# Patient Record
Sex: Female | Born: 1950
Health system: Southern US, Community
[De-identification: ages and names within clinical notes are randomized; demographics above are authoritative.]

## PROBLEM LIST (undated history)

## (undated) DIAGNOSIS — I499 Cardiac arrhythmia, unspecified: Secondary | ICD-10-CM

## (undated) DIAGNOSIS — H269 Unspecified cataract: Secondary | ICD-10-CM

## (undated) DIAGNOSIS — Z923 Personal history of irradiation: Secondary | ICD-10-CM

## (undated) DIAGNOSIS — IMO0001 Reserved for inherently not codable concepts without codable children: Secondary | ICD-10-CM

## (undated) DIAGNOSIS — Z5189 Encounter for other specified aftercare: Secondary | ICD-10-CM

## (undated) DIAGNOSIS — M1990 Unspecified osteoarthritis, unspecified site: Secondary | ICD-10-CM

## (undated) DIAGNOSIS — C50919 Malignant neoplasm of unspecified site of unspecified female breast: Secondary | ICD-10-CM

## (undated) DIAGNOSIS — F419 Anxiety disorder, unspecified: Secondary | ICD-10-CM

## (undated) DIAGNOSIS — Z973 Presence of spectacles and contact lenses: Secondary | ICD-10-CM

## (undated) DIAGNOSIS — E079 Disorder of thyroid, unspecified: Secondary | ICD-10-CM

## (undated) HISTORY — DX: Personal history of irradiation: Z92.3

## (undated) HISTORY — DX: Unspecified cataract: H26.9

## (undated) HISTORY — DX: Malignant neoplasm of unspecified site of unspecified female breast: C50.919

## (undated) HISTORY — DX: Disorder of thyroid, unspecified: E07.9

---

## 1993-03-07 HISTORY — PX: CYSTECTOMY: SHX5119

## 1997-10-08 ENCOUNTER — Other Ambulatory Visit: Admission: RE | Admit: 1997-10-08 | Discharge: 1997-10-08 | Payer: Self-pay | Admitting: Obstetrics & Gynecology

## 1997-10-15 ENCOUNTER — Ambulatory Visit (HOSPITAL_COMMUNITY): Admission: RE | Admit: 1997-10-15 | Discharge: 1997-10-15 | Payer: Self-pay | Admitting: Obstetrics & Gynecology

## 1998-10-20 ENCOUNTER — Ambulatory Visit (HOSPITAL_COMMUNITY): Admission: RE | Admit: 1998-10-20 | Discharge: 1998-10-20 | Payer: Self-pay | Admitting: Obstetrics & Gynecology

## 1998-10-20 ENCOUNTER — Encounter: Payer: Self-pay | Admitting: Obstetrics & Gynecology

## 1999-02-01 ENCOUNTER — Other Ambulatory Visit: Admission: RE | Admit: 1999-02-01 | Discharge: 1999-02-01 | Payer: Self-pay | Admitting: Obstetrics & Gynecology

## 1999-10-22 ENCOUNTER — Encounter: Payer: Self-pay | Admitting: Obstetrics & Gynecology

## 1999-10-22 ENCOUNTER — Ambulatory Visit (HOSPITAL_COMMUNITY): Admission: RE | Admit: 1999-10-22 | Discharge: 1999-10-22 | Payer: Self-pay | Admitting: Obstetrics & Gynecology

## 1999-10-28 ENCOUNTER — Encounter: Payer: Self-pay | Admitting: Obstetrics & Gynecology

## 1999-10-28 ENCOUNTER — Encounter: Admission: RE | Admit: 1999-10-28 | Discharge: 1999-10-28 | Payer: Self-pay | Admitting: Obstetrics & Gynecology

## 2000-07-05 ENCOUNTER — Encounter: Admission: RE | Admit: 2000-07-05 | Discharge: 2000-07-05 | Payer: Self-pay | Admitting: Obstetrics & Gynecology

## 2000-07-05 ENCOUNTER — Encounter: Payer: Self-pay | Admitting: Obstetrics & Gynecology

## 2000-07-13 ENCOUNTER — Ambulatory Visit (HOSPITAL_COMMUNITY): Admission: RE | Admit: 2000-07-13 | Discharge: 2000-07-13 | Payer: Self-pay | Admitting: Cardiology

## 2000-12-01 ENCOUNTER — Encounter: Payer: Self-pay | Admitting: Obstetrics & Gynecology

## 2000-12-01 ENCOUNTER — Encounter: Admission: RE | Admit: 2000-12-01 | Discharge: 2000-12-01 | Payer: Self-pay | Admitting: Obstetrics & Gynecology

## 2001-03-27 ENCOUNTER — Other Ambulatory Visit: Admission: RE | Admit: 2001-03-27 | Discharge: 2001-03-27 | Payer: Self-pay | Admitting: Obstetrics & Gynecology

## 2002-03-25 ENCOUNTER — Encounter: Payer: Self-pay | Admitting: Obstetrics & Gynecology

## 2002-03-25 ENCOUNTER — Encounter: Admission: RE | Admit: 2002-03-25 | Discharge: 2002-03-25 | Payer: Self-pay | Admitting: Obstetrics & Gynecology

## 2002-05-31 ENCOUNTER — Other Ambulatory Visit: Admission: RE | Admit: 2002-05-31 | Discharge: 2002-05-31 | Payer: Self-pay | Admitting: Obstetrics & Gynecology

## 2003-06-05 ENCOUNTER — Encounter: Admission: RE | Admit: 2003-06-05 | Discharge: 2003-06-05 | Payer: Self-pay | Admitting: Obstetrics & Gynecology

## 2003-06-10 ENCOUNTER — Other Ambulatory Visit: Admission: RE | Admit: 2003-06-10 | Discharge: 2003-06-10 | Payer: Self-pay | Admitting: Obstetrics & Gynecology

## 2004-06-15 ENCOUNTER — Encounter: Admission: RE | Admit: 2004-06-15 | Discharge: 2004-06-15 | Payer: Self-pay | Admitting: Obstetrics & Gynecology

## 2004-07-08 ENCOUNTER — Other Ambulatory Visit: Admission: RE | Admit: 2004-07-08 | Discharge: 2004-07-08 | Payer: Self-pay | Admitting: Obstetrics & Gynecology

## 2005-07-06 ENCOUNTER — Encounter: Admission: RE | Admit: 2005-07-06 | Discharge: 2005-07-06 | Payer: Self-pay | Admitting: Obstetrics & Gynecology

## 2006-07-11 ENCOUNTER — Encounter: Admission: RE | Admit: 2006-07-11 | Discharge: 2006-07-11 | Payer: Self-pay | Admitting: Obstetrics & Gynecology

## 2007-03-08 HISTORY — PX: COLONOSCOPY: SHX174

## 2007-05-09 ENCOUNTER — Ambulatory Visit: Payer: Self-pay | Admitting: Gastroenterology

## 2007-05-21 ENCOUNTER — Ambulatory Visit: Payer: Self-pay | Admitting: Gastroenterology

## 2007-07-12 ENCOUNTER — Encounter: Admission: RE | Admit: 2007-07-12 | Discharge: 2007-07-12 | Payer: Self-pay | Admitting: Obstetrics & Gynecology

## 2008-04-01 ENCOUNTER — Encounter: Admission: RE | Admit: 2008-04-01 | Discharge: 2008-06-30 | Payer: Self-pay | Admitting: Orthopaedic Surgery

## 2008-07-03 ENCOUNTER — Encounter: Admission: RE | Admit: 2008-07-03 | Discharge: 2008-07-16 | Payer: Self-pay | Admitting: Orthopaedic Surgery

## 2008-07-16 ENCOUNTER — Encounter: Admission: RE | Admit: 2008-07-16 | Discharge: 2008-07-16 | Payer: Self-pay | Admitting: Obstetrics & Gynecology

## 2009-07-17 ENCOUNTER — Encounter: Admission: RE | Admit: 2009-07-17 | Discharge: 2009-07-17 | Payer: Self-pay | Admitting: Obstetrics & Gynecology

## 2009-07-23 ENCOUNTER — Encounter: Admission: RE | Admit: 2009-07-23 | Discharge: 2009-07-23 | Payer: Self-pay | Admitting: Obstetrics & Gynecology

## 2009-08-28 ENCOUNTER — Emergency Department (HOSPITAL_COMMUNITY): Admission: EM | Admit: 2009-08-28 | Discharge: 2009-08-28 | Payer: Self-pay | Admitting: Emergency Medicine

## 2010-01-08 ENCOUNTER — Encounter: Admission: RE | Admit: 2010-01-08 | Discharge: 2010-01-08 | Payer: Self-pay | Admitting: Obstetrics & Gynecology

## 2010-05-23 LAB — POCT CARDIAC MARKERS: Myoglobin, poc: 58.5 ng/mL (ref 12–200)

## 2010-05-23 LAB — DIFFERENTIAL
Basophils Absolute: 0 10*3/uL (ref 0.0–0.1)
Eosinophils Absolute: 0.5 10*3/uL (ref 0.0–0.7)
Eosinophils Relative: 8 % — ABNORMAL HIGH (ref 0–5)
Lymphocytes Relative: 36 % (ref 12–46)
Lymphs Abs: 2.2 10*3/uL (ref 0.7–4.0)
Monocytes Absolute: 0.7 10*3/uL (ref 0.1–1.0)
Neutro Abs: 2.7 10*3/uL (ref 1.7–7.7)

## 2010-05-23 LAB — GLUCOSE, CAPILLARY: Glucose-Capillary: 89 mg/dL (ref 70–99)

## 2010-05-23 LAB — CBC
HCT: 35.1 % — ABNORMAL LOW (ref 36.0–46.0)
Hemoglobin: 11.9 g/dL — ABNORMAL LOW (ref 12.0–15.0)
MCH: 27.8 pg (ref 26.0–34.0)
Platelets: 249 10*3/uL (ref 150–400)
WBC: 6.1 10*3/uL (ref 4.0–10.5)

## 2010-05-23 LAB — POCT I-STAT, CHEM 8
BUN: 18 mg/dL (ref 6–23)
Calcium, Ion: 1.16 mmol/L (ref 1.12–1.32)
Chloride: 104 mEq/L (ref 96–112)
Creatinine, Ser: 0.9 mg/dL (ref 0.4–1.2)
Glucose, Bld: 93 mg/dL (ref 70–99)
Sodium: 140 mEq/L (ref 135–145)

## 2010-05-23 LAB — URINALYSIS, ROUTINE W REFLEX MICROSCOPIC
Bilirubin Urine: NEGATIVE
Hgb urine dipstick: NEGATIVE
Ketones, ur: NEGATIVE mg/dL
Nitrite: NEGATIVE
Specific Gravity, Urine: >1.03 — ABNORMAL HIGH (ref 1.005–1.030)
pH: 5.5 (ref 5.0–8.0)

## 2010-07-28 ENCOUNTER — Other Ambulatory Visit: Payer: Self-pay | Admitting: Obstetrics & Gynecology

## 2010-07-28 DIAGNOSIS — N63 Unspecified lump in unspecified breast: Secondary | ICD-10-CM

## 2010-08-16 ENCOUNTER — Ambulatory Visit
Admission: RE | Admit: 2010-08-16 | Discharge: 2010-08-16 | Disposition: A | Discharge status: Home / Self Care | Payer: BC Managed Care – PPO | Source: Ambulatory Visit | Attending: Obstetrics & Gynecology | Admitting: Obstetrics & Gynecology

## 2010-08-16 DIAGNOSIS — N63 Unspecified lump in unspecified breast: Secondary | ICD-10-CM

## 2011-03-08 HISTORY — PX: BREAST LUMPECTOMY: SHX2

## 2011-08-29 ENCOUNTER — Other Ambulatory Visit: Payer: Self-pay | Admitting: Obstetrics & Gynecology

## 2011-08-29 DIAGNOSIS — Z1231 Encounter for screening mammogram for malignant neoplasm of breast: Secondary | ICD-10-CM

## 2011-09-06 ENCOUNTER — Ambulatory Visit: Payer: BC Managed Care – PPO

## 2011-09-16 ENCOUNTER — Ambulatory Visit
Admission: RE | Admit: 2011-09-16 | Discharge: 2011-09-16 | Disposition: A | Discharge status: Home / Self Care | Payer: BC Managed Care – PPO | Source: Ambulatory Visit | Attending: Obstetrics & Gynecology | Admitting: Obstetrics & Gynecology

## 2011-09-16 DIAGNOSIS — Z1231 Encounter for screening mammogram for malignant neoplasm of breast: Secondary | ICD-10-CM

## 2011-09-20 ENCOUNTER — Other Ambulatory Visit: Payer: Self-pay | Admitting: Obstetrics & Gynecology

## 2011-09-20 DIAGNOSIS — R928 Other abnormal and inconclusive findings on diagnostic imaging of breast: Secondary | ICD-10-CM

## 2011-10-04 ENCOUNTER — Ambulatory Visit
Admission: RE | Admit: 2011-10-04 | Discharge: 2011-10-04 | Disposition: A | Discharge status: Home / Self Care | Payer: BC Managed Care – PPO | Source: Ambulatory Visit | Attending: Obstetrics & Gynecology | Admitting: Obstetrics & Gynecology

## 2011-10-04 ENCOUNTER — Other Ambulatory Visit: Payer: Self-pay | Admitting: Obstetrics & Gynecology

## 2011-10-04 DIAGNOSIS — R928 Other abnormal and inconclusive findings on diagnostic imaging of breast: Secondary | ICD-10-CM

## 2011-10-04 DIAGNOSIS — C50919 Malignant neoplasm of unspecified site of unspecified female breast: Secondary | ICD-10-CM

## 2011-10-04 HISTORY — PX: OTHER SURGICAL HISTORY: SHX169

## 2011-10-04 HISTORY — DX: Malignant neoplasm of unspecified site of unspecified female breast: C50.919

## 2011-10-05 ENCOUNTER — Other Ambulatory Visit: Payer: Self-pay | Admitting: Obstetrics & Gynecology

## 2011-10-05 ENCOUNTER — Ambulatory Visit
Admission: RE | Admit: 2011-10-05 | Discharge: 2011-10-05 | Disposition: A | Discharge status: Home / Self Care | Payer: BC Managed Care – PPO | Source: Ambulatory Visit | Attending: Obstetrics & Gynecology | Admitting: Obstetrics & Gynecology

## 2011-10-05 DIAGNOSIS — C50911 Malignant neoplasm of unspecified site of right female breast: Secondary | ICD-10-CM

## 2011-10-05 DIAGNOSIS — R928 Other abnormal and inconclusive findings on diagnostic imaging of breast: Secondary | ICD-10-CM

## 2011-10-06 ENCOUNTER — Telehealth: Payer: Self-pay | Admitting: *Deleted

## 2011-10-06 ENCOUNTER — Other Ambulatory Visit: Payer: Self-pay | Admitting: *Deleted

## 2011-10-06 DIAGNOSIS — C50419 Malignant neoplasm of upper-outer quadrant of unspecified female breast: Secondary | ICD-10-CM

## 2011-10-06 NOTE — Telephone Encounter (Signed)
Confirmed [facility] for 8/7/13at 0800 .  Instructions and contact information given.

## 2011-10-10 ENCOUNTER — Ambulatory Visit
Admission: RE | Admit: 2011-10-10 | Discharge: 2011-10-10 | Disposition: A | Discharge status: Home / Self Care | Payer: BC Managed Care – PPO | Source: Ambulatory Visit | Attending: Obstetrics & Gynecology | Admitting: Obstetrics & Gynecology

## 2011-10-10 DIAGNOSIS — C50911 Malignant neoplasm of unspecified site of right female breast: Secondary | ICD-10-CM

## 2011-10-10 MED ORDER — GADOBENATE DIMEGLUMINE 529 MG/ML IV SOLN
13.0000 mL | Freq: Once | INTRAVENOUS | Status: AC | PRN
  Administered 2011-10-10: 13 mL via INTRAVENOUS

## 2011-10-12 ENCOUNTER — Encounter (INDEPENDENT_AMBULATORY_CARE_PROVIDER_SITE_OTHER): Payer: BC Managed Care – PPO | Admitting: Surgery

## 2011-10-12 ENCOUNTER — Ambulatory Visit: Payer: BC Managed Care – PPO | Attending: Surgery | Admitting: Physical Therapy

## 2011-10-12 ENCOUNTER — Ambulatory Visit (HOSPITAL_BASED_OUTPATIENT_CLINIC_OR_DEPARTMENT_OTHER): Payer: BC Managed Care – PPO | Admitting: Oncology

## 2011-10-12 ENCOUNTER — Encounter (INDEPENDENT_AMBULATORY_CARE_PROVIDER_SITE_OTHER): Payer: Self-pay | Admitting: Surgery

## 2011-10-12 ENCOUNTER — Ambulatory Visit (HOSPITAL_BASED_OUTPATIENT_CLINIC_OR_DEPARTMENT_OTHER): Payer: BC Managed Care – PPO | Admitting: Surgery

## 2011-10-12 ENCOUNTER — Encounter: Payer: Self-pay | Admitting: *Deleted

## 2011-10-12 ENCOUNTER — Ambulatory Visit: Payer: BC Managed Care – PPO

## 2011-10-12 ENCOUNTER — Other Ambulatory Visit (HOSPITAL_BASED_OUTPATIENT_CLINIC_OR_DEPARTMENT_OTHER): Payer: BC Managed Care – PPO

## 2011-10-12 ENCOUNTER — Other Ambulatory Visit (INDEPENDENT_AMBULATORY_CARE_PROVIDER_SITE_OTHER): Payer: Self-pay | Admitting: Surgery

## 2011-10-12 ENCOUNTER — Ambulatory Visit
Admission: RE | Admit: 2011-10-12 | Discharge: 2011-10-12 | Disposition: A | Discharge status: Home / Self Care | Payer: BC Managed Care – PPO | Source: Ambulatory Visit | Attending: Radiation Oncology | Admitting: Radiation Oncology

## 2011-10-12 VITALS — BP 148/74 | HR 71 | Temp 98.6°F | Resp 20 | Ht 64.5 in | Wt 138.0 lb

## 2011-10-12 DIAGNOSIS — C50419 Malignant neoplasm of upper-outer quadrant of unspecified female breast: Secondary | ICD-10-CM

## 2011-10-12 DIAGNOSIS — Z170 Estrogen receptor positive status [ER+]: Secondary | ICD-10-CM

## 2011-10-12 DIAGNOSIS — IMO0001 Reserved for inherently not codable concepts without codable children: Secondary | ICD-10-CM | POA: Insufficient documentation

## 2011-10-12 DIAGNOSIS — M25619 Stiffness of unspecified shoulder, not elsewhere classified: Secondary | ICD-10-CM | POA: Insufficient documentation

## 2011-10-12 DIAGNOSIS — C50919 Malignant neoplasm of unspecified site of unspecified female breast: Secondary | ICD-10-CM | POA: Insufficient documentation

## 2011-10-12 LAB — CBC WITH DIFFERENTIAL/PLATELET
Eosinophils Absolute: 0.1 10*3/uL (ref 0.0–0.5)
HCT: 36.5 % (ref 34.8–46.6)
LYMPH%: 40.7 % (ref 14.0–49.7)
MCHC: 34 g/dL (ref 31.5–36.0)
MCV: 78.9 fL — ABNORMAL LOW (ref 79.5–101.0)
MONO#: 0.4 10*3/uL (ref 0.1–0.9)
MONO%: 8.2 % (ref 0.0–14.0)
NEUT#: 2.3 10*3/uL (ref 1.5–6.5)
NEUT%: 48.5 % (ref 38.4–76.8)
Platelets: 275 10*3/uL (ref 145–400)
RBC: 4.63 10*6/uL (ref 3.70–5.45)
WBC: 4.7 10*3/uL (ref 3.9–10.3)

## 2011-10-12 LAB — COMPREHENSIVE METABOLIC PANEL
Alkaline Phosphatase: 59 U/L (ref 39–117)
CO2: 29 mEq/L (ref 19–32)
Creatinine, Ser: 0.83 mg/dL (ref 0.50–1.10)
Glucose, Bld: 144 mg/dL — ABNORMAL HIGH (ref 70–99)
Sodium: 135 mEq/L (ref 135–145)
Total Bilirubin: 0.4 mg/dL (ref 0.3–1.2)

## 2011-10-12 NOTE — Progress Notes (Signed)
Dominique Adams [id number] 03-30-1950 61 y.o. 10/12/2011 12:11 PM  CC  Kari Baars, MD [address] [place], [place]. [place] [address]  REASON FOR CONSULTATION:  Breast cancer  Patient was seen in the Multidisciplinary Breast Clinic for discussion of her treatment options. She was seen by Dr. Pierce Crane, Radiation Onc[facility]  STAGE:   Cancer of upper-outer quadrant of female breast   Primary site: Breast (Right)   Staging method: AJCC 7th Edition   Clinical: Stage IA (T1a, N0, cM0)   Summary: Stage IA (T1a, N0, cM0)  REFERRING PHYSICIAN: Dr. Eric Form  HISTORY OF PRESENT ILLNESS:  Dominique Adams is a 61 y.o. female.  [facility] who is referred for evaluation of breast cancer. She has undergone annual screening mammography. Screening mammogram performed 09/16/2011 showed calcifications in the right breast. A diagnostic mammogram and ultrasound from 10/04/2011 confirmed an area of pleomorphic calcifications her right upper outer quadrant. A biopsy was recommended and took place on 10/04/2011. This showed invasive ductal cancer, grade 1 ER +100%, PR +95%, proliferative index 10% and HER-2 nonamplified the ratio 1.35. The patient was not have an MRI scan of both breasts and 10/10/2011 and this confirmed the presence of a 1.9 x 1.9 x 1.8 cm area of abnormality associated with clip artifact. No other abnormalities were detected. Of note that was a 7 mm intramammary lymph node which appeared to be normal.   Past Medical History: Past Medical History  Diagnosis Date  . Breast cancer   . Thyroid disease    tachycardia on beta blocker  Past Surgical History No past surgical history on file.  Family History: Family History  Problem Relation Age of Onset  . Arrhythmia Father 69  . Hypertension Father   . Arrhythmia Mother 49  . Hypertension Mother     chf  . Diabetes Mother   . Diabetes Father   .  Cancer Mother     lung  . Cancer Father age 85      prostate ca    Social History History  Substance Use Topics  . Smoking status: Never Smoker   . Smokeless tobacco: Not on file  . Alcohol Use: No    Allergies: No Known Allergies  Current Medications: Current Outpatient Prescriptions  Medication Sig Dispense Refill  . Calcium Carb-Cholecalciferol (CALCIUM-VITAMIN D3) 600-500 MG-UNIT CAPS Take by mouth 2 (two) times daily.      . fish oil-omega-3 fatty acids 1000 MG capsule Take 2 g by mouth daily.      Marland Kitchen levothyroxine (SYNTHROID, LEVOTHROID) 50 MCG tablet Take 50 mcg by mouth daily.      . metoprolol succinate (TOPROL-XL) 25 MG 24 hr tablet Take 25 mg by mouth daily.      Marland Kitchen PARoxetine (PAXIL) 20 MG tablet Take 20 mg by mouth every morning.        OB/GYN History: G2 P2, menarche age 61, history of hormone replacement therapy first 2006 months since at 2003 discontinued approximately 32,011, age of first birth 33  Fertility Discussion: NA Prior History of Cancer: No  Health Maintenance:  Colonoscopy yes Bone Density yes Last PAP smear 09/15/2011  ECOG PERFORMANCE STATUS: 0 - Asymptomatic  Genetic Counseling/testing: No  REVIEW OF SYSTEMS:  A comprehensive review of systems was negative.  PHYSICAL EXAMINATION: Blood pressure 148/74, pulse 71, temperature 98.6 F (37 C), temperature source Oral, resp. rate 20, height 5' 4.5" (1.638 m), weight 138 lb (62.596 kg).  HEENT:  Sclerae anicteric, conjunctivae pink.  Oropharynx clear.  No mucositis or candidiasis.  Nodes:  No cervical, supraclavicular, or axillary lymphadenopathy palpated.  Breast Exam:  Right breast is status post I..  No masses, discharge, skin change, or nipple inversion.  Left breast is benign.  No masses, discharge, skin change, or nipple inversion..  Lungs:  Clear to auscultation bilaterally.  No crackles, rhonchi, or wheezes.  Heart:  Regular rate and rhythm.  Abdomen:  Soft, nontender.  Positive bowel  sounds.  No organomegaly or masses palpated.  Musculoskeletal:  No focal spinal tenderness to palpation.  Extremities:  Benign.  No peripheral edema or cyanosis.  Skin:  Benign.  Neuro:  Nonfocal.      STUDIES/RESULTS: Mr Breast Bilateral W Wo Contrast  10/10/2011  *RADIOLOGY REPORT*  Clinical Data: Recently diagnosed right breast invasive mammary carcinoma with lobular features and DCIS.  Preoperative evaluation.  BUN and creatinine were obtained on site at [facility] at [address] Results:  BUN 11 mg/dL,  Creatinine 0.9 mg/dL.  BILATERAL BREAST MRI WITH AND WITHOUT CONTRAST  Technique: Multiplanar, multisequence MR images of both breasts were obtained prior to and following the intravenous administration of 13ml of Multihance.  Three dimensional images were evaluated at the independent DynaCad workstation.  Comparison:  Mammograms dated 10/04/2011, 09/16/2011, 08/16/2010, 01/08/2010, 07/24/2009, 07/17/2009, 07/16/2008, 07/12/2007.  Findings: There is a small post biopsy hematoma with mild rim enhancement located superiorly within the upper-outer quadrant of the right breast at approximately the 11 o'clock position. This measures 1.9 x 1.9 x 1.8 cm in size and is associated with central clip artifact or corresponding to the recently biopsied invasive mammary carcinoma with DCIS.  Located 5 mm posterior to the post biopsy change  is a stable  intramammary lymph node which measures 7 mm in size.  There are multiple nearby intramammary lymph nodes located within the upper-outer quadrant right breast which also appear stable mammographically. There are no  additional worrisome enhancing foci within either breast.  There are no worrisome axillary or internal mammary lymph nodes.  There are no additional findings.  IMPRESSION: 1.9 cm in diameter area of enhancement consistent with post biopsy change and residual DCIS  with central clip artifact located within the upper-outer quadrant right breast  at the 11 o'clock position. There is a 7 mm in size intramammary lymph node seen  5 mm posterior to this biopsy site.  However, this appears stable when compared to prior mammograms as do multiple adjacent intramammary lymph nodes within the right upper-outer quadrant.  No additional worrisome enhancing foci and no additional findings.  RECOMMENDATION: Treatment plan  THREE-DIMENSIONAL MR IMAGE RENDERING ON INDEPENDENT WORKSTATION:  Three-dimensional MR images were rendered by post-processing of the original MR data on an independent workstation.  The three- dimensional MR images were interpreted, and findings were reported in the accompanying complete MRI report for this study.  BI-RADS CATEGORY 6:  Known biopsy-proven malignancy - appropriate action should be taken.  Original Report Authenticated By: Rolla Plate, M.D.   Mm Breast Stereo Biopsy Right  10/04/2011  *RADIOLOGY REPORT*  Clinical Data:  Suspicious right upper outer quadrant calcifications  STEREOTACTIC-GUIDED VACUUM ASSISTED BIOPSY OF THE RIGHT BREAST AND SPECIMEN RADIOGRAPH  Comparison: Previous exams  I met with the patient and we discussed the procedure of stereotactic-guided biopsy, including benefits and alternatives. We discussed the high likelihood of a successful procedure. We discussed the risks of the procedure, including infection, bleeding, tissue injury, clip migration,  and inadequate sampling. Informed, written consent was given.  Using sterile technique, 2% lidocaine, stereotactic guidance, and a 9 gauge vacuum assisted device, biopsy was performed of right breast calcifications in the upper outer quadrant using a lateral to medial approach.  Specimen radiograph was performed, showing calcifications in the biopsy samples.  Specimens with calcifications are identified for pathology.  At the conclusion of the procedure, a tissue marker clip was deployed into the biopsy cavity.  Follow-up 2-view mammogram confirmed clip placement at  the biopsy site.  IMPRESSION: Stereotactic-guided biopsy of right upper outer quadrant calcifications, with T shaped clip placement.  Pathology is pending.  No apparent complications.  Original Report Authenticated By: Harrel Lemon, M.D.   Mm Breast Surgical Specimen  10/04/2011  *RADIOLOGY REPORT*  Clinical Data:  Suspicious right upper outer quadrant calcifications  STEREOTACTIC-GUIDED VACUUM ASSISTED BIOPSY OF THE RIGHT BREAST AND SPECIMEN RADIOGRAPH  Comparison: Previous exams  I met with the patient and we discussed the procedure of stereotactic-guided biopsy, including benefits and alternatives. We discussed the high likelihood of a successful procedure. We discussed the risks of the procedure, including infection, bleeding, tissue injury, clip migration, and inadequate sampling. Informed, written consent was given.  Using sterile technique, 2% lidocaine, stereotactic guidance, and a 9 gauge vacuum assisted device, biopsy was performed of right breast calcifications in the upper outer quadrant using a lateral to medial approach.  Specimen radiograph was performed, showing calcifications in the biopsy samples.  Specimens with calcifications are identified for pathology.  At the conclusion of the procedure, a tissue marker clip was deployed into the biopsy cavity.  Follow-up 2-view mammogram confirmed clip placement at the biopsy site.  IMPRESSION: Stereotactic-guided biopsy of right upper outer quadrant calcifications, with T shaped clip placement.  Pathology is pending.  No apparent complications.  Original Report Authenticated By: Harrel Lemon, M.D.   Mm Digital Diag Ltd R  10/04/2011  *RADIOLOGY REPORT*  Clinical Data:  Screening callback for questioned right breast calcifications  DIGITAL DIAGNOSTIC RIGHT MAMMOGRAM WITHOUT CAD  Comparison:  Prior exams  Findings:  Additional views confirm a 1 cm area of irregular density with associated internal fine pleomorphic calcifications in the right  upper outer quadrant.  This corresponds to the mammographic finding.  IMPRESSION: Suspicious right upper outer quadrant calcifications.  Stereotactic core needle biopsy will be performed and dictated separately. Findings and recommendations discussed with the patient and provided in written form at the time of the exam.  BI-RADS CATEGORY 4:  Suspicious abnormality - biopsy should be considered.  Recommendation:  Right stereotactic core needle biopsy  Original Report Authenticated By: Harrel Lemon, M.D.   Mm Digital Screening  09/19/2011  *RADIOLOGY REPORT*  Clinical Data: Screening.  DIGITAL SCREENING MAMMOGRAM WITH CAD  Comparison:  Previous exams  Findings: Two views of each breast demonstrate scattered fibroglandular densities.  In the right breast, calcifications warrant further evaluation with magnified views.  In the left breast, no mass or malignant type calcifications are identified.  Images were processed with CAD.  IMPRESSION: Further evaluation is suggested for calcifications in the right breast.  RECOMMENDATION: Diagnostic mammogram of the right breast. (Code:FI-R-39M)  BI-RADS CATEGORY 0:  Incomplete.  Need additional imaging evaluation and/or prior mammograms for comparison.  Original Report Authenticated By: Hiram Gash, M.D.   Mm Radiologist Eval And Mgmt  10/05/2011  *RADIOLOGY REPORT*  ESTABLISHED PATIENT OFFICE VISIT - LEVEL II [phone])  Chief Complaint:  The patient presents with her sister for discussion of  pathology results after stereotactic core needle biopsy of the right breast on 10/04/2011.  History:  Calcifications were detected at screening mammography in the right upper outer quadrant and stereotactic core needle biopsy was performed.  Exam:  The incision is clean and dry without ecchymosis or hematoma.  Pathology: Pathology demonstrates invasive mammary carcinoma and DCIS, which is concordant with the imaging appearance.  Assessment and Plan:  Preoperative breast MRI  is scheduled 10/10/2011 at 8:30 a.m. arrival time.  Multidisciplinary breast cancer clinic appointment is scheduled 10/12/2011.  All questions were answered.  The patient reports no problems at the biopsy site overnight.  Original Report Authenticated By: Harrel Lemon, M.D.     LABS:    Chemistry      Component Value Date/Time   NA 135 10/12/2011 0846   K 3.9 10/12/2011 0846   CL 96 10/12/2011 0846   CO2 29 10/12/2011 0846   BUN 19 10/12/2011 0846   CREATININE 0.83 10/12/2011 0846      Component Value Date/Time   CALCIUM 9.3 10/12/2011 0846   ALKPHOS 59 10/12/2011 0846   AST 18 10/12/2011 0846   ALT 9 10/12/2011 0846   BILITOT 0.4 10/12/2011 0846      Lab Results  Component Value Date   WBC 4.7 10/12/2011   HGB 12.4 10/12/2011   HCT 36.5 10/12/2011   MCV 78.9* 10/12/2011   PLT 275 10/12/2011       PATHOLOGY: Grade 1 ER/PR positive HER-2 negative ductal carcinoma  ASSESSMENT    We had a fairly lengthy discussion regarding her past holiday and expected treatment. I indicated that she is a great candidate for lumpectomy and will see her afterwards to discuss adjuvant therapy. I also discussed the Oncotype test with her. I expect that this dictation will be sent for this to determine whether she requires chemotherapy. She is an excellent candidate for adjuvant hormonal therapy. I also discussed that with her. We will likely get a up-to-date bone density test prior to starting the hormonal therapy.  Clinical Trial Eligibility: No Multidisciplinary conference discussion yes    PLAN:    Plan is as outlined lumpectomy, sentinel lymph node evaluation followed by submission of tissue for Oncotype testing and then evaluation for possible adjuvant therapy. That with chemotherapy. She is an excellent candidate for hormonal therapy. Followup in in 3 weeks       Discussion: Patient is being treated per NCCN breast cancer care guidelines appropriate for stage. I   Thank you so much for allowing me to  participate in the care of Dominique Adams. I will continue to follow up the patient with you and assist in her care.  All questions were answered. The patient knows to call the clinic with any problems, questions or concerns. We can certainly see the patient much sooner if necessary.  I spent 40 minutes counseling the patient face to face. The total time spent in the appointment was 20 minutes.      Pierce Crane M.D. FRCP C. 10/12/2011, 12:11 PM

## 2011-10-12 NOTE — Progress Notes (Signed)
[facility] Radiation Oncology NEW PATIENT EVALUATION  Name: Dominique Adams MRN: [id number]  Date:   10/12/2011           DOB: Jul 02, 1950  Status: outpatient   CC: Kari Baars, MD  Streck, Reola Mosher, MD    REFERRING PHYSICIAN: Currie Paris, MD   DIAGNOSIS: Stage I (T1, N0, M0) invasive ductal carcinoma/DCIS of the right breast   HISTORY OF PRESENT ILLNESS:  Dominique Adams is a 61 y.o. female who is seen today at the [facility] for evaluation of her clinical stage I (T1, N0, M0) invasive ductal/DCIS of the right breast. At the time of a screening mammogram on 09/16/2011 she was noted to have suspicious calcifications within the right breast leading to further evaluation at the [facility] Center on 10/04/2011. She was found to have suspicious calcifications within the upper-outer quadrant of the right breast with a stereotactic core biopsy the same day diagnostic for invasive mammary carcinoma, favoring invasive ductal along with DCIS associated with calcifications. Her carcinoma was strongly ER positive at 100% and PR positive at 95% with a low proliferation Ki-67 of 10%. HER-2/neu was not amplified. Breast MR on 10/10/2011 showed a 1.9 cm area of enhancement consistent with post biopsy change in residual DCIS. This was at approximately 11:00. She is also felt to have a 7 mm intramammary node 5 mm posterior to the biopsy site, unchanged. She seen today with Dr. Jamey Ripa and Dr. Donnie Coffin. PREVIOUS RADIATION THERAPY: No   PAST MEDICAL HISTORY:  has a past medical history of Breast cancer and Thyroid disease.     PAST SURGICAL HISTORY: No past surgical history on file.   FAMILY HISTORY: family history includes Arrhythmia (age of onset:87) in her father; Arrhythmia (age of onset:90) in her mother; Cancer in her father and mother; Diabetes in her father and mother; and Hypertension in her father and mother. Her father died from prostate cancer and 87, in her mother died from lung  cancer at age 33. No family history of breast cancer.   SOCIAL HISTORY:  reports that she has never smoked. She does not have any smokeless tobacco history on file. She reports that she does not drink alcohol or use illicit drugs. 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 Married with 2 sons, and 43-year-old grandson.   ALLERGIES: Review of patient's allergies indicates no known allergies.   MEDICATIONS:  Current Outpatient Prescriptions  Medication Sig Dispense Refill  . Calcium Carb-Cholecalciferol (CALCIUM-VITAMIN D3) 600-500 MG-UNIT CAPS Take by mouth 2 (two) times daily.      . fish oil-omega-3 fatty acids 1000 MG capsule Take 2 g by mouth daily.      Marland Kitchen levothyroxine (SYNTHROID, LEVOTHROID) 50 MCG tablet Take 50 mcg by mouth daily.      . metoprolol succinate (TOPROL-XL) 25 MG 24 hr tablet Take 25 mg by mouth daily.      Marland Kitchen PARoxetine (PAXIL) 20 MG tablet Take 20 mg by mouth every morning.         REVIEW OF SYSTEMS:  Pertinent items are noted in HPI.    PHYSICAL EXAM: Alert and oriented 61 year old after American female appearing much younger than her stated age. Wt Readings from Last 3 Encounters:  10/12/11 138 lb (62.596 kg)  10/12/11 138 lb (62.596 kg)   Temp Readings from Last 3 Encounters:  10/12/11 98.6 F (37 C)   10/12/11 98.6 F (37 C) Oral   BP Readings from Last 3  Encounters:  10/12/11 148/74  10/12/11 148/74   Pulse Readings from Last 3 Encounters:  10/12/11 71  10/12/11 71   And neck examination: Grossly unremarkable. Nodes: Without palpable cervical, supraclavicular, or axillary lymphadenopathy. Chest: Lungs clear. Heart: Regular rate and rhythm. Back: Without spinal or CVA tenderness. Breasts: There is a punctate biopsy wound at approximately 11:00 along the upper outer quadrant of the right breast with surrounding ecchymosis. No discreet masses are appreciated. Left breast without masses or lesions. Abdomen without hepatomegaly.  Extremities without edema. Neurologic examination: Grossly nonfocal.    LABORATORY DATA:  Lab Results  Component Value Date   WBC 4.7 10/12/2011   HGB 12.4 10/12/2011   HCT 36.5 10/12/2011   MCV 78.9* 10/12/2011   PLT 275 10/12/2011   Lab Results  Component Value Date   NA 135 10/12/2011   K 3.9 10/12/2011   CL 96 10/12/2011   CO2 29 10/12/2011   Lab Results  Component Value Date   ALT 9 10/12/2011   AST 18 10/12/2011   ALKPHOS 59 10/12/2011   BILITOT 0.4 10/12/2011      IMPRESSION: Clinical stage I (T1, N0, M0) invasive ductal/DCIS of the right breast. I explained to the patient and her family that her local regional treatment options include mastectomy with sentinel lymph node biopsy or partial mastectomy with sentinel lymph node biopsy followed by radiation therapy. She desires breast preservation. She seen with Dr. Donnie Coffin who will obtain Oncotype DX testing to determine whether not she would benefit from adjuvant chemotherapy in addition to adjuvant hormone therapy. We discussed the sequencing of treatments along with the potential acute and late toxicities of radiation therapy. At this point in time her prognosis appears to be favorable.   PLAN: As discussed above. I like to see her for a followup visit in approximately 2-3 weeks following her partial mastectomy/sentinel lymph node biopsy.   I spent 40 minutes minutes face to face with the patient and more than 50% of that time was spent in counseling and/or coordination of care.

## 2011-10-12 NOTE — Patient Instructions (Signed)
We will schedule surgery to remove the breast cancer in the upper outer part of your right breast and remove some lymph nodes from the armpit to be sure the cancer has not spread there. We will try to get this scheduled fairly soon. If you have any questions, or have not heard from my office about scheduling please call the office at [phone]

## 2011-10-12 NOTE — Progress Notes (Signed)
[facility] Psychosocial Distress Screening Clinical Social Work  Pt completed distress screening protocol, and scored a 3 on the Psychosocial Distress Thermometer which indicates mild distress. Clinical Child psychotherapist met with pt, pt's husband, and son in [facility] to assess for distress and other psychosocial needs.  Pt stated she was going well, but acknowledged the emotional aspect of treatment.  CSW informed pt and family of the support team and support programs at [facility].  CSW also reviewed the reach to recovery program and pt agreed to referral.  Pt has a positive support system in her family, but was appreciative of the information and resources provided.  CSW encouraged pt and family to call with any questions or concerns.     Tamala Julian, MSW, LCSW Clinical Social Worker [facility] [phone]

## 2011-10-12 NOTE — Progress Notes (Signed)
Patient ID: Dominique Adams, female   DOB: 07/10/1950, 60 y.o.   MRN: [id number]  Chief Complaint  Patient presents with  . Breast Cancer    Right    HPI Dominique Adams is a 60 y.o. female.  She had a mammogram recently and an abnormality was found in the high upper outer quadrant of the right breast. A needle core biopsy showed invasive ductal carcinoma, receptor positive, HER-2/neu negative. An MRI showed no other lesions. She is seen in the breast multidisciplinary conference today for evaluation. She's had no breast symptoms no prior breast problems and no family history of significance. HPI  Past Medical History  Diagnosis Date  . Breast cancer   . Thyroid disease     No past surgical history on file.  Family History  Problem Relation Age of Onset  . Arrhythmia Father 87  . Hypertension Father   . Arrhythmia Mother 90  . Hypertension Mother     chf  . Diabetes Mother   . Diabetes Father   . Cancer Mother     lung  . Cancer Father     prostate ca    Social History History  Substance Use Topics  . Smoking status: Never Smoker   . Smokeless tobacco: Not on file  . Alcohol Use: No    No Known Allergies  Current Outpatient Prescriptions  Medication Sig Dispense Refill  . Calcium Carb-Cholecalciferol (CALCIUM-VITAMIN D3) 600-500 MG-UNIT CAPS Take by mouth 2 (two) times daily.      . fish oil-omega-3 fatty acids 1000 MG capsule Take 2 g by mouth daily.      . levothyroxine (SYNTHROID, LEVOTHROID) 50 MCG tablet Take 50 mcg by mouth daily.      . metoprolol succinate (TOPROL-XL) 25 MG 24 hr tablet Take 25 mg by mouth daily.      . PARoxetine (PAXIL) 20 MG tablet Take 20 mg by mouth every morning.        Review of Systems Review of Systems  Constitutional: Negative for fever, chills and unexpected weight change.  HENT: Negative for hearing loss, congestion, sore throat, trouble swallowing and voice change.   Eyes: Negative for visual disturbance.  Respiratory:  Negative for cough and wheezing.   Cardiovascular: Negative for chest pain, palpitations and leg swelling.  Gastrointestinal: Negative for nausea, vomiting, abdominal pain, diarrhea, constipation, blood in stool, abdominal distention and anal bleeding.  Genitourinary: Negative for hematuria, vaginal bleeding and difficulty urinating.  Musculoskeletal: Negative for arthralgias.  Skin: Negative for rash and wound.  Neurological: Negative for seizures, syncope and headaches.  Hematological: Negative for adenopathy. Does not bruise/bleed easily.  Psychiatric/Behavioral: Negative for confusion.    Blood pressure 148/74, pulse 71, temperature 98.6 F (37 C), resp. rate 20, height 5' 4.5" (1.638 m), weight 138 lb (62.596 kg).  Physical Exam Physical Exam  Vitals reviewed. Constitutional: She is oriented to person, place, and time. She appears well-developed and well-nourished. No distress.  HENT:  Head: Normocephalic and atraumatic.  Mouth/Throat: Oropharynx is clear and moist.  Eyes: Conjunctivae and EOM are normal. Pupils are equal, round, and reactive to light. No scleral icterus.  Neck: Normal range of motion. Neck supple. No tracheal deviation present. No thyromegaly present.  Cardiovascular: Normal rate, regular rhythm, normal heart sounds and intact distal pulses.  Exam reveals no gallop and no friction rub.   No murmur heard. Pulmonary/Chest: Effort normal and breath sounds normal. No respiratory distress. She has no wheezes. She has no rales.           Ecchymotic area high in the upper outer quadrant on the right side. The breasts are otherwise unremarkable. No skin changes are noted.  Abdominal: Soft. Bowel sounds are normal. She exhibits no distension and no mass. There is no tenderness. There is no rebound and no guarding.  Musculoskeletal: Normal range of motion. She exhibits no edema and no tenderness.  Lymphadenopathy:    She has no cervical adenopathy.    She has no axillary  adenopathy.  Neurological: She is alert and oriented to person, place, and time.  Skin: Skin is warm and dry. No rash noted. She is not diaphoretic. No erythema.  Psychiatric: She has a normal mood and affect. Her behavior is normal. Judgment and thought content normal.    Data Reviewed I have reviewed the mammogram films and reports in the MRI films and reports with the radiologist. I have reviewed the pathology report and slides with the pathologist.  Assessment    Right breast cancer, upper outer quadrant, clinical stage I    Plan    I have explained the pathophysiology and staging of breast cancer with particular attention to her exact situation. We discussed the multidisciplinary approach to breast cancer which often includes both medical and radiation oncology consultations.  We also discussed surgical options for the treatment of breast cancer including lumpectomy and mastectomy with possible reconstructive surgery. In addition we talked about the evaluation and management of lymph nodes including a description of sentinel lymph node biopsy and axillary dissections. We reviewed potential complications and risks including bleeding, infection, numbness,  lymphedema, and the potential need for additional surgery.  She understands that for patients who are candidate for lumpectomy or mastectomy there is an equal survival rate with either technique, but a slightly higher local recurrence rate with lumpectomy. In addition she knows that a lumpectomy usually requires postoperative radiation as part of the management of the breast cancer.  We have discussed the likely postoperative course and plans for followup.  I have given the patient some written information that reviewed all of these issues. I believe her questions are answered and that she has a good understanding of the issues. After our discussion we will proceed with scheduling a wire localized right lumpectomy with sentinel lymph  node evaluation as an outpatient. I think all the questions have been answered. A decision about chemotherapy will be deferred until final pathology reports and Oncotype result is available. Assuming negative margins and negative lymph nodes she will need radiation therapy.       Marybell Robards J 10/12/2011, 10:16 AM   CC: Dr Shaw and Dr Neal 

## 2011-10-13 ENCOUNTER — Encounter: Payer: Self-pay | Admitting: *Deleted

## 2011-10-13 ENCOUNTER — Telehealth: Payer: Self-pay | Admitting: Oncology

## 2011-10-13 NOTE — Telephone Encounter (Signed)
lmonvm adviising the pt of her sept appts with dr Donnie Coffin

## 2011-10-17 ENCOUNTER — Telehealth: Payer: Self-pay | Admitting: *Deleted

## 2011-10-17 NOTE — Telephone Encounter (Signed)
Spoke to pt concerning [facility] from 8/7.  Pt denies questions or concerns regarding dx or treatment care plan. Encourage pt to call with further needs.  Pt request to speak to [facility].  Will give [facility] pt information.  Contact information given.

## 2011-10-18 ENCOUNTER — Encounter: Payer: Self-pay | Admitting: *Deleted

## 2011-10-18 NOTE — Progress Notes (Signed)
Mailed after appt letter to pt. 

## 2011-10-25 ENCOUNTER — Encounter (HOSPITAL_BASED_OUTPATIENT_CLINIC_OR_DEPARTMENT_OTHER): Payer: Self-pay | Admitting: *Deleted

## 2011-10-25 NOTE — Progress Notes (Signed)
To come in for cxr and bmet-ekg at dr Leandro Reasoner office

## 2011-10-26 ENCOUNTER — Encounter (HOSPITAL_BASED_OUTPATIENT_CLINIC_OR_DEPARTMENT_OTHER)
Admission: RE | Admit: 2011-10-26 | Discharge: 2011-10-26 | Disposition: A | Discharge status: Home / Self Care | Payer: BC Managed Care – PPO | Source: Ambulatory Visit | Attending: Surgery | Admitting: Surgery

## 2011-10-26 ENCOUNTER — Ambulatory Visit
Admission: RE | Admit: 2011-10-26 | Discharge: 2011-10-26 | Disposition: A | Discharge status: Home / Self Care | Payer: BC Managed Care – PPO | Source: Ambulatory Visit | Attending: Surgery | Admitting: Surgery

## 2011-10-26 LAB — BASIC METABOLIC PANEL
BUN: 14 mg/dL (ref 6–23)
GFR calc Af Amer: >90 mL/min (ref >90)
GFR calc non Af Amer: 89 mL/min — ABNORMAL LOW (ref >90)
Potassium: 4.9 mEq/L (ref 3.5–5.1)
Sodium: 138 mEq/L (ref 135–145)

## 2011-10-26 NOTE — Progress Notes (Signed)
Got notes from dr Saundra Shelling notes where she saw dr Deborah Chalk 2011 for palpitation-put on meds-and released her back to dr Clelia Croft are in storage-have called-may not get until after surgery date

## 2011-10-28 ENCOUNTER — Ambulatory Visit
Admission: RE | Admit: 2011-10-28 | Discharge: 2011-10-28 | Disposition: A | Discharge status: Home / Self Care | Payer: BC Managed Care – PPO | Source: Ambulatory Visit | Attending: Surgery | Admitting: Surgery

## 2011-10-28 ENCOUNTER — Encounter (HOSPITAL_BASED_OUTPATIENT_CLINIC_OR_DEPARTMENT_OTHER)
Admission: RE | Disposition: A | Discharge status: Home / Self Care | Payer: Self-pay | Source: Ambulatory Visit | Attending: Surgery

## 2011-10-28 ENCOUNTER — Ambulatory Visit (HOSPITAL_COMMUNITY)
Admission: RE | Admit: 2011-10-28 | Discharge: 2011-10-28 | Disposition: A | Discharge status: Home / Self Care | Payer: BC Managed Care – PPO | Source: Ambulatory Visit | Attending: Surgery | Admitting: Surgery

## 2011-10-28 ENCOUNTER — Ambulatory Visit (HOSPITAL_BASED_OUTPATIENT_CLINIC_OR_DEPARTMENT_OTHER): Payer: BC Managed Care – PPO | Admitting: Certified Registered Nurse Anesthetist

## 2011-10-28 ENCOUNTER — Encounter (HOSPITAL_BASED_OUTPATIENT_CLINIC_OR_DEPARTMENT_OTHER): Payer: Self-pay | Admitting: Certified Registered Nurse Anesthetist

## 2011-10-28 ENCOUNTER — Encounter (HOSPITAL_BASED_OUTPATIENT_CLINIC_OR_DEPARTMENT_OTHER): Payer: Self-pay

## 2011-10-28 ENCOUNTER — Ambulatory Visit (HOSPITAL_BASED_OUTPATIENT_CLINIC_OR_DEPARTMENT_OTHER)
Admission: RE | Admit: 2011-10-28 | Discharge: 2011-10-28 | Disposition: A | Discharge status: Home / Self Care | Payer: BC Managed Care – PPO | Source: Ambulatory Visit | Attending: Surgery | Admitting: Surgery

## 2011-10-28 DIAGNOSIS — C50419 Malignant neoplasm of upper-outer quadrant of unspecified female breast: Secondary | ICD-10-CM

## 2011-10-28 DIAGNOSIS — D0590 Unspecified type of carcinoma in situ of unspecified breast: Secondary | ICD-10-CM

## 2011-10-28 HISTORY — PX: OTHER SURGICAL HISTORY: SHX169

## 2011-10-28 HISTORY — DX: Presence of spectacles and contact lenses: Z97.3

## 2011-10-28 HISTORY — DX: Cardiac arrhythmia, unspecified: I49.9

## 2011-10-28 HISTORY — DX: Unspecified osteoarthritis, unspecified site: M19.90

## 2011-10-28 SURGERY — BREAST LUMPECTOMY WITH NEEDLE LOCALIZATION AND AXILLARY SENTINEL LYMPH NODE BX
Anesthesia: General | Site: Breast | Laterality: Right | Wound class: Clean

## 2011-10-28 MED ORDER — TECHNETIUM TC 99M SULFUR COLLOID FILTERED
1.0000 | Freq: Once | INTRAVENOUS | Status: AC | PRN
  Administered 2011-10-28: 1 via INTRADERMAL

## 2011-10-28 MED ORDER — BUPIVACAINE HCL (PF) 0.25 % IJ SOLN
INTRAMUSCULAR | Status: DC | PRN
  Administered 2011-10-28: 30 mL

## 2011-10-28 MED ORDER — HYDROMORPHONE HCL PF 1 MG/ML IJ SOLN: 0.2500 mg | INTRAMUSCULAR | Status: DC | PRN

## 2011-10-28 MED ORDER — FENTANYL CITRATE 0.05 MG/ML IJ SOLN
50.0000 ug | INTRAMUSCULAR | Status: DC | PRN
  Administered 2011-10-28: 25 ug via INTRAVENOUS
  Administered 2011-10-28: 100 ug via INTRAVENOUS

## 2011-10-28 MED ORDER — LACTATED RINGERS IV SOLN
INTRAVENOUS | Status: DC
  Administered 2011-10-28 (×2): via INTRAVENOUS

## 2011-10-28 MED ORDER — GLYCOPYRROLATE 0.2 MG/ML IJ SOLN
INTRAMUSCULAR | Status: DC | PRN
  Administered 2011-10-28: 0.2 mg via INTRAVENOUS

## 2011-10-28 MED ORDER — MIDAZOLAM HCL 2 MG/2ML IJ SOLN
0.5000 mg | INTRAMUSCULAR | Status: DC | PRN
  Administered 2011-10-28: 2 mg via INTRAVENOUS

## 2011-10-28 MED ORDER — LIDOCAINE HCL (CARDIAC) 20 MG/ML IV SOLN
INTRAVENOUS | Status: DC | PRN
  Administered 2011-10-28: 60 mg via INTRAVENOUS

## 2011-10-28 MED ORDER — ONDANSETRON HCL 4 MG/2ML IJ SOLN
INTRAMUSCULAR | Status: DC | PRN
  Administered 2011-10-28: 4 mg via INTRAVENOUS

## 2011-10-28 MED ORDER — DEXAMETHASONE SODIUM PHOSPHATE 4 MG/ML IJ SOLN
INTRAMUSCULAR | Status: DC | PRN
  Administered 2011-10-28: 10 mg via INTRAVENOUS

## 2011-10-28 MED ORDER — CHLORHEXIDINE GLUCONATE 4 % EX LIQD: 1.0000 "application " | Freq: Once | CUTANEOUS | Status: DC

## 2011-10-28 MED ORDER — FENTANYL CITRATE 0.05 MG/ML IJ SOLN
INTRAMUSCULAR | Status: DC | PRN
  Administered 2011-10-28 (×2): 25 ug via INTRAVENOUS

## 2011-10-28 MED ORDER — OXYCODONE HCL 5 MG/5ML PO SOLN: 5.0000 mg | Freq: Once | ORAL | Status: AC | PRN

## 2011-10-28 MED ORDER — METOCLOPRAMIDE HCL 5 MG/ML IJ SOLN
INTRAMUSCULAR | Status: DC | PRN
  Administered 2011-10-28: 10 mg via INTRAVENOUS

## 2011-10-28 MED ORDER — OXYCODONE-ACETAMINOPHEN 5-325 MG PO TABS: 1.0000 | ORAL_TABLET | ORAL | Status: AC | PRN

## 2011-10-28 MED ORDER — PROMETHAZINE HCL 25 MG/ML IJ SOLN: 6.2500 mg | Freq: Once | INTRAMUSCULAR | Status: DC

## 2011-10-28 MED ORDER — CEFAZOLIN SODIUM-DEXTROSE 2-3 GM-% IV SOLR
2.0000 g | INTRAVENOUS | Status: AC
  Administered 2011-10-28: 2 g via INTRAVENOUS

## 2011-10-28 MED ORDER — ACETAMINOPHEN 10 MG/ML IV SOLN
1000.0000 mg | Freq: Once | INTRAVENOUS | Status: AC
  Administered 2011-10-28: 1000 mg via INTRAVENOUS

## 2011-10-28 MED ORDER — PROPOFOL 10 MG/ML IV EMUL
INTRAVENOUS | Status: DC | PRN
  Administered 2011-10-28: 200 mg via INTRAVENOUS

## 2011-10-28 MED ORDER — OXYCODONE HCL 5 MG PO TABS
5.0000 mg | ORAL_TABLET | Freq: Once | ORAL | Status: AC | PRN
  Administered 2011-10-28: 5 mg via ORAL

## 2011-10-28 MED ORDER — SODIUM CHLORIDE 0.9 % IJ SOLN
INTRAMUSCULAR | Status: DC | PRN
  Administered 2011-10-28: 14:00:00

## 2011-10-28 SURGICAL SUPPLY — 66 items
APPLIER CLIP 11 MED OPEN (CLIP)
APPLIER CLIP 9.375 MED OPEN (MISCELLANEOUS)
BINDER BREAST LRG (GAUZE/BANDAGES/DRESSINGS) ×2 IMPLANT
BLADE HEX COATED 2.75 (ELECTRODE) ×2 IMPLANT
BLADE SURG 15 STRL LF DISP TIS (BLADE) ×1 IMPLANT
BLADE SURG 15 STRL SS (BLADE) ×1
CANISTER SUCTION 1200CC (MISCELLANEOUS) ×2 IMPLANT
CHLORAPREP W/TINT 26ML (MISCELLANEOUS) ×2 IMPLANT
CLIP APPLIE 11 MED OPEN (CLIP) IMPLANT
CLIP APPLIE 9.375 MED OPEN (MISCELLANEOUS) IMPLANT
CLIP TI MEDIUM 6 (CLIP) IMPLANT
CLIP TI WIDE RED SMALL 6 (CLIP) ×2 IMPLANT
CLOTH BEACON ORANGE TIMEOUT ST (SAFETY) ×2 IMPLANT
COVER MAYO STAND STRL (DRAPES) ×2 IMPLANT
COVER PROBE 5X48 (MISCELLANEOUS)
COVER PROBE W GEL 5X96 (DRAPES) ×2 IMPLANT
COVER TABLE BACK 60X90 (DRAPES) ×2 IMPLANT
DECANTER SPIKE VIAL GLASS SM (MISCELLANEOUS) IMPLANT
DERMABOND ADVANCED (GAUZE/BANDAGES/DRESSINGS) ×2
DERMABOND ADVANCED .7 DNX12 (GAUZE/BANDAGES/DRESSINGS) ×2 IMPLANT
DEVICE DUBIN W/COMP PLATE 8390 (MISCELLANEOUS) ×2 IMPLANT
DRAIN CHANNEL 19F RND (DRAIN) IMPLANT
DRAPE LAPAROSCOPIC ABDOMINAL (DRAPES) ×2 IMPLANT
DRAPE SURG 17X23 STRL (DRAPES) ×2 IMPLANT
DRAPE UTILITY XL STRL (DRAPES) ×2 IMPLANT
DRSG EMULSION OIL 3X3 NADH (GAUZE/BANDAGES/DRESSINGS) IMPLANT
ELECT BLADE 4.0 EZ CLEAN MEGAD (MISCELLANEOUS)
ELECT REM PT RETURN 9FT ADLT (ELECTROSURGICAL) ×2
ELECTRODE BLDE 4.0 EZ CLN MEGD (MISCELLANEOUS) IMPLANT
ELECTRODE REM PT RTRN 9FT ADLT (ELECTROSURGICAL) ×1 IMPLANT
EVACUATOR SILICONE 100CC (DRAIN) IMPLANT
GLOVE BIOGEL M STRL SZ7.5 (GLOVE) ×2 IMPLANT
GLOVE BIOGEL PI IND STRL 8 (GLOVE) ×1 IMPLANT
GLOVE BIOGEL PI INDICATOR 8 (GLOVE) ×1
GLOVE EUDERMIC 7 POWDERFREE (GLOVE) ×2 IMPLANT
GLOVE SKINSENSE NS SZ7.0 (GLOVE) ×1
GLOVE SKINSENSE STRL SZ7.0 (GLOVE) ×1 IMPLANT
GOWN PREVENTION PLUS XLARGE (GOWN DISPOSABLE) ×4 IMPLANT
GOWN PREVENTION PLUS XXLARGE (GOWN DISPOSABLE) ×2 IMPLANT
KIT CVR 48X5XPRB PLUP LF (MISCELLANEOUS) IMPLANT
KIT MARKER MARGIN INK (KITS) ×2 IMPLANT
NDL SAFETY ECLIPSE 18X1.5 (NEEDLE) ×1 IMPLANT
NEEDLE HYPO 18GX1.5 SHARP (NEEDLE) ×1
NEEDLE HYPO 25X1 1.5 SAFETY (NEEDLE) ×4 IMPLANT
NS IRRIG 1000ML POUR BTL (IV SOLUTION) ×2 IMPLANT
PACK BASIN DAY SURGERY FS (CUSTOM PROCEDURE TRAY) ×2 IMPLANT
PENCIL BUTTON HOLSTER BLD 10FT (ELECTRODE) ×2 IMPLANT
PIN SAFETY STERILE (MISCELLANEOUS) IMPLANT
SHEET MEDIUM DRAPE 40X70 STRL (DRAPES) ×2 IMPLANT
SLEEVE SCD COMPRESS KNEE MED (MISCELLANEOUS) ×2 IMPLANT
SPONGE GAUZE 4X4 12PLY (GAUZE/BANDAGES/DRESSINGS) IMPLANT
SPONGE INTESTINAL PEANUT (DISPOSABLE) IMPLANT
SPONGE LAP 18X18 X RAY DECT (DISPOSABLE) IMPLANT
SPONGE LAP 4X18 X RAY DECT (DISPOSABLE) ×2 IMPLANT
STAPLER VISISTAT 35W (STAPLE) IMPLANT
SUT ETHILON 2 0 FS 18 (SUTURE) IMPLANT
SUT ETHILON 3 0 FSL (SUTURE) IMPLANT
SUT MNCRL AB 4-0 PS2 18 (SUTURE) ×2 IMPLANT
SUT VIC AB 4-0 BRD 54 (SUTURE) IMPLANT
SUT VICRYL 3-0 CR8 SH (SUTURE) ×2 IMPLANT
SYR CONTROL 10ML LL (SYRINGE) ×4 IMPLANT
TOWEL OR 17X24 6PK STRL BLUE (TOWEL DISPOSABLE) ×2 IMPLANT
TOWEL OR NON WOVEN STRL DISP B (DISPOSABLE) ×2 IMPLANT
TUBE CONNECTING 20X1/4 (TUBING) ×2 IMPLANT
WATER STERILE IRR 1000ML POUR (IV SOLUTION) ×2 IMPLANT
YANKAUER SUCT BULB TIP NO VENT (SUCTIONS) ×2 IMPLANT

## 2011-10-28 NOTE — Op Note (Signed)
Dominique Adams 09-18-1950 [id number] 10/12/2011  Preoperative diagnosis: carcinoma right breast upper outer quadrant clinical stage I  Postoperative diagnosis: the same  Procedure: wire localized right partial mastectomy with blue dye injection and axillary sentinel lymph node biopsy (3 lymph nodes removed)  Surgeon: Currie Paris, MD, FACS   Anesthesia: General   Clinical History and Indications: this patient was recently found to have a right invasive ductal carcinoma in the upper-outer quadrant of the right breast fairly high and almost into the axilla. After discussion of alternatives with the patient she elected to proceed to excision with sentinel node evaluation    Description of Procedure: I saw the patient in the preoperative area and confirmed the plans with the patient and her family. The right breast as marked as the operative side. I reviewed the guidewire localizing films.  The patient taken to the operating room and after satisfactory general anesthesia was obtained the timeout was done. I injected 5 cc of methylene blue diluted, and the periareolar area. This was massaged in.  A full prep and drape was then done. The guidewire entered laterally and high basically at the edge of the axilla. It traveled transversely. I estimated the location of the clip based on the guidewire localizing films and made a curvilinear incision directly over the clip.I mobilized the clip into the wound. I then excised all the tissue around the guidewire going beyond it and down to the muscle fascia. I thought the clip was well in the middle of the specimen I could feel the tumor and follows around it in all directions. The specimen mammogram showed the clip in the middle of the specimen.however, it appeared that the tumor might be a little bit close to the superior margin. I took some extra superior margin and labeled that separately. All specimens were inked for orientation purposes for  pathology.  I irrigated made sure everything was dry. I put 20 cc of 0.25% plain Marcaine and to help with postop pain relief. I used clips to mark all of the margins.  Using the neoprobe I could see that the high area was just under the edge of the pectoralis and easily assessable through my current incision since the tumor basically had been directly over the lateral edge of the pectoralis. I did some preliminary dissection into the axillary tissue and found a blue lymph node which had counts of about 600 when removed. I didn't see any other blue lymph nodes were found to other hot nodes that were adjacent to the primary node. These were removed. At that point there were no palpably abnormal nodes, no other blue nodes, and no hot areas in the axilla.I injected an additional 0.25% plain Marcaine in. I irrigated again and make sure everything was dry.  The incision was then closed with 3-0 Vicryl, 4-0 Monocryl subcuticular, and Dermabond. The patient tolerated the procedure well. There were no operative complications. All counts were correct.  .10/28/2011 2:46 PM

## 2011-10-28 NOTE — H&P (View-Only) (Signed)
Patient ID: Dominique Adams, female   DOB: 1950/06/07, 61 y.o.   MRN: [id number]  Chief Complaint  Patient presents with  . Breast Cancer    Right    HPI Dominique Adams is a 61 y.o. female.  She had a mammogram recently and an abnormality was found in the high upper outer quadrant of the right breast. A needle core biopsy showed invasive ductal carcinoma, receptor positive, HER-2/neu negative. An MRI showed no other lesions. She is seen in the breast multidisciplinary conference today for evaluation. She's had no breast symptoms no prior breast problems and no family history of significance. HPI  Past Medical History  Diagnosis Date  . Breast cancer   . Thyroid disease     No past surgical history on file.  Family History  Problem Relation Age of Onset  . Arrhythmia Father 6  . Hypertension Father   . Arrhythmia Mother 31  . Hypertension Mother     chf  . Diabetes Mother   . Diabetes Father   . Cancer Mother     lung  . Cancer Father     prostate ca    Social History History  Substance Use Topics  . Smoking status: Never Smoker   . Smokeless tobacco: Not on file  . Alcohol Use: No    No Known Allergies  Current Outpatient Prescriptions  Medication Sig Dispense Refill  . Calcium Carb-Cholecalciferol (CALCIUM-VITAMIN D3) 600-500 MG-UNIT CAPS Take by mouth 2 (two) times daily.      . fish oil-omega-3 fatty acids 1000 MG capsule Take 2 g by mouth daily.      Marland Kitchen levothyroxine (SYNTHROID, LEVOTHROID) 50 MCG tablet Take 50 mcg by mouth daily.      . metoprolol succinate (TOPROL-XL) 25 MG 24 hr tablet Take 25 mg by mouth daily.      Marland Kitchen PARoxetine (PAXIL) 20 MG tablet Take 20 mg by mouth every morning.        Review of Systems Review of Systems  Constitutional: Negative for fever, chills and unexpected weight change.  HENT: Negative for hearing loss, congestion, sore throat, trouble swallowing and voice change.   Eyes: Negative for visual disturbance.  Respiratory:  Negative for cough and wheezing.   Cardiovascular: Negative for chest pain, palpitations and leg swelling.  Gastrointestinal: Negative for nausea, vomiting, abdominal pain, diarrhea, constipation, blood in stool, abdominal distention and anal bleeding.  Genitourinary: Negative for hematuria, vaginal bleeding and difficulty urinating.  Musculoskeletal: Negative for arthralgias.  Skin: Negative for rash and wound.  Neurological: Negative for seizures, syncope and headaches.  Hematological: Negative for adenopathy. Does not bruise/bleed easily.  Psychiatric/Behavioral: Negative for confusion.    Blood pressure 148/74, pulse 71, temperature 98.6 F (37 C), resp. rate 20, height 5' 4.5" (1.638 m), weight 138 lb (62.596 kg).  Physical Exam Physical Exam  Vitals reviewed. Constitutional: She is oriented to person, place, and time. She appears well-developed and well-nourished. No distress.  HENT:  Head: Normocephalic and atraumatic.  Mouth/Throat: Oropharynx is clear and moist.  Eyes: Conjunctivae and EOM are normal. Pupils are equal, round, and reactive to light. No scleral icterus.  Neck: Normal range of motion. Neck supple. No tracheal deviation present. No thyromegaly present.  Cardiovascular: Normal rate, regular rhythm, normal heart sounds and intact distal pulses.  Exam reveals no gallop and no friction rub.   No murmur heard. Pulmonary/Chest: Effort normal and breath sounds normal. No respiratory distress. She has no wheezes. She has no rales.  Ecchymotic area high in the upper outer quadrant on the right side. The breasts are otherwise unremarkable. No skin changes are noted.  Abdominal: Soft. Bowel sounds are normal. She exhibits no distension and no mass. There is no tenderness. There is no rebound and no guarding.  Musculoskeletal: Normal range of motion. She exhibits no edema and no tenderness.  Lymphadenopathy:    She has no cervical adenopathy.    She has no axillary  adenopathy.  Neurological: She is alert and oriented to person, place, and time.  Skin: Skin is warm and dry. No rash noted. She is not diaphoretic. No erythema.  Psychiatric: She has a normal mood and affect. Her behavior is normal. Judgment and thought content normal.    Data Reviewed I have reviewed the mammogram films and reports in the MRI films and reports with the radiologist. I have reviewed the pathology report and slides with the pathologist.  Assessment    Right breast cancer, upper outer quadrant, clinical stage I    Plan    I have explained the pathophysiology and staging of breast cancer with particular attention to her exact situation. We discussed the multidisciplinary approach to breast cancer which often includes both medical and radiation oncology consultations.  We also discussed surgical options for the treatment of breast cancer including lumpectomy and mastectomy with possible reconstructive surgery. In addition we talked about the evaluation and management of lymph nodes including a description of sentinel lymph node biopsy and axillary dissections. We reviewed potential complications and risks including bleeding, infection, numbness,  lymphedema, and the potential need for additional surgery.  She understands that for patients who are candidate for lumpectomy or mastectomy there is an equal survival rate with either technique, but a slightly higher local recurrence rate with lumpectomy. In addition she knows that a lumpectomy usually requires postoperative radiation as part of the management of the breast cancer.  We have discussed the likely postoperative course and plans for followup.  I have given the patient some written information that reviewed all of these issues. I believe her questions are answered and that she has a good understanding of the issues. After our discussion we will proceed with scheduling a wire localized right lumpectomy with sentinel lymph  node evaluation as an outpatient. I think all the questions have been answered. A decision about chemotherapy will be deferred until final pathology reports and Oncotype result is available. Assuming negative margins and negative lymph nodes she will need radiation therapy.       Abednego Yeates J 10/12/2011, 10:16 AM   CC: Dr Clelia Croft and Dr Jennette Kettle

## 2011-10-28 NOTE — Anesthesia Procedure Notes (Signed)
Procedure Name: LMA Insertion Date/Time: 10/28/2011 1:35 PM Performed by: Gar Gibbon Pre-anesthesia Checklist: Patient identified, Emergency Drugs available, Suction available and Patient being monitored Patient Re-evaluated:Patient Re-evaluated prior to inductionOxygen Delivery Method: Circle System Utilized Preoxygenation: Pre-oxygenation with 100% oxygen Intubation Type: IV induction Ventilation: Mask ventilation without difficulty LMA: LMA inserted LMA Size: 4.0 Number of attempts: 1 Airway Equipment and Method: bite block Placement Confirmation: positive ETCO2 Tube secured with: Tape Dental Injury: Teeth and Oropharynx as per pre-operative assessment

## 2011-10-28 NOTE — Transfer of Care (Signed)
Immediate Anesthesia Transfer of Care Note  Patient: Dominique Adams  Procedure(s) Performed: Procedure(s) (LRB): BREAST LUMPECTOMY WITH NEEDLE LOCALIZATION AND AXILLARY SENTINEL LYMPH NODE BX (Right)  Patient Location: PACU  Anesthesia Type: General  Level of Consciousness: sedated  Airway & Oxygen Therapy: Patient Spontanous Breathing and Patient connected to face mask oxygen  Post-op Assessment: Report given to PACU RN and Post -op Vital signs reviewed and stable  Post vital signs: Reviewed and stable  Complications: No apparent anesthesia complications

## 2011-10-28 NOTE — Anesthesia Postprocedure Evaluation (Signed)
  Anesthesia Post-op Note  Patient: Dominique Adams  Procedure(s) Performed: Procedure(s) (LRB): BREAST LUMPECTOMY WITH NEEDLE LOCALIZATION AND AXILLARY SENTINEL LYMPH NODE BX (Right)  Patient Location: PACU  Anesthesia Type: General  Level of Consciousness: awake  Airway and Oxygen Therapy: Patient Spontanous Breathing  Post-op Pain: mild  Post-op Assessment: Post-op Vital signs reviewed, Patient's Cardiovascular Status Stable, Respiratory Function Stable, Patent Airway and No signs of Nausea or vomiting  Post-op Vital Signs: Reviewed and stable  Complications: No apparent anesthesia complications

## 2011-10-28 NOTE — Interval H&P Note (Signed)
History and Physical Interval Note:  10/28/2011 1:25 PM  Dominique Adams  has presented today for surgery, with the diagnosis of right breast cancer  The various methods of treatment have been discussed with the patient and family. After consideration of risks, benefits and other options for treatment, the patient has consented to  Procedure(s) (LRB): BREAST LUMPECTOMY WITH NEEDLE LOCALIZATION AND AXILLARY SENTINEL LYMPH NODE BX (Right) as a surgical intervention .  The patient's history has been reviewed, patient examined, no change in status, stable for surgery.  I have reviewed the patient's chart and labs.  Questions were answered to the patient's satisfaction.     [name] J  Right breast marked as the operative side and the wire loc films are reviewed

## 2011-10-28 NOTE — Anesthesia Preprocedure Evaluation (Addendum)
Anesthesia Evaluation  Patient identified by MRN, date of birth, ID band Patient awake    Reviewed: Allergy & Precautions, H&P , NPO status , Patient's Chart, lab work & pertinent test results, reviewed documented beta blocker date and time   History of Anesthesia Complications (+) AWARENESS UNDER ANESTHESIA  Airway Mallampati: II TM Distance: >3 FB Neck ROM: Full    Dental No notable dental hx. (+) Teeth Intact and Dental Advisory Given   Pulmonary neg pulmonary ROS,  breath sounds clear to auscultation  Pulmonary exam normal       Cardiovascular + dysrhythmias Rhythm:Regular Rate:Normal     Neuro/Psych negative neurological ROS  negative psych ROS   GI/Hepatic negative GI ROS, Neg liver ROS,   Endo/Other  negative endocrine ROS  Renal/GU negative Renal ROS  negative genitourinary   Musculoskeletal   Abdominal   Peds  Hematology negative hematology ROS (+)   Anesthesia Other Findings   Reproductive/Obstetrics negative OB ROS                          Anesthesia Physical Anesthesia Plan  ASA: II  Anesthesia Plan: General   Post-op Pain Management:    Induction: Intravenous  Airway Management Planned: LMA  Additional Equipment:   Intra-op Plan:   Post-operative Plan: Extubation in OR  Informed Consent: I have reviewed the patients History and Physical, chart, labs and discussed the procedure including the risks, benefits and alternatives for the proposed anesthesia with the patient or authorized representative who has indicated his/her understanding and acceptance.   Dental advisory given  Plan Discussed with: CRNA  Anesthesia Plan Comments:         Anesthesia Quick Evaluation

## 2011-11-01 ENCOUNTER — Encounter: Payer: Self-pay | Admitting: *Deleted

## 2011-11-01 NOTE — Progress Notes (Signed)
Ordered Oncotype Dx test w/ [facility].  Faxed request to [facility].  Faxed PAC to [employer]. 

## 2011-11-03 ENCOUNTER — Telehealth (INDEPENDENT_AMBULATORY_CARE_PROVIDER_SITE_OTHER): Payer: Self-pay | Admitting: General Surgery

## 2011-11-03 NOTE — Telephone Encounter (Signed)
Message copied by Liliana Cline on Thu Nov 03, 2011 10:11 AM ------      Message from: [place], [place]      Created: Wed Nov 02, 2011  4:04 PM      Regarding: Dr Jamey Ripa      Contact: [phone]       Pt want know result of pathology report. Please call pt concerning this.            Thanks

## 2011-11-03 NOTE — Telephone Encounter (Signed)
Patient aware path results are good. Lymph nodes negative and margins ok. Pathology will be gone over in more detail when she sees Dr Jamey Ripa in the office. She will follow up in the office at her scheduled appt and call with any questions prior.

## 2011-11-15 ENCOUNTER — Ambulatory Visit (INDEPENDENT_AMBULATORY_CARE_PROVIDER_SITE_OTHER): Payer: BC Managed Care – PPO | Admitting: Surgery

## 2011-11-15 ENCOUNTER — Encounter (INDEPENDENT_AMBULATORY_CARE_PROVIDER_SITE_OTHER): Payer: Self-pay | Admitting: Surgery

## 2011-11-15 VITALS — BP 130/70 | HR 76 | Resp 14 | Ht 64.5 in | Wt 138.0 lb

## 2011-11-15 DIAGNOSIS — C50419 Malignant neoplasm of upper-outer quadrant of unspecified female breast: Secondary | ICD-10-CM

## 2011-11-15 DIAGNOSIS — Z09 Encounter for follow-up examination after completed treatment for conditions other than malignant neoplasm: Secondary | ICD-10-CM

## 2011-11-15 NOTE — Progress Notes (Signed)
NAME: Dominique Adams                                            DOB: March 28, 1950 DATE: 11/15/2011                                                  MRN: [id number]  CC: Post op   HPI: This patient comes in for post op follow-up.Sheunderwent right lumpectomy and sentinel removal through a single high right upper outer quadrant incision on 10/28/2011. She feels that she is doing well.  PE:  VITAL SIGNS: BP 130/70  Pulse 76  Resp 14  Ht 5' 4.5" (1.638 m)  Wt 138 lb (62.596 kg)  BMI 23.32 kg/m2  General: The patient appears to be healthy, NAD Incision is healing well. I do not believe there is a seroma present. It is somewhat firm consistent with postsurgical changes.  DATA REVIEWED: Pathology report shows a 0.9 cm basal ductal carcinoma, receptor positive, HER-2 negative, margins okay. 4 nodes were negative.  IMPRESSION: The patient is doing well S/P lumpectomy and sentinel node.    PLAN: She will see me again in about 3 months. Dominique Adams has medical radiation oncology appointment scheduled and is planning for radiation therapy to begin in a few weeks. She'll call if she has any problems. I gave her a copy of the pathology report discussed it with her.

## 2011-11-15 NOTE — Patient Instructions (Signed)
See me again in about three months. Call if you have any questions or problems

## 2011-11-18 ENCOUNTER — Telehealth (INDEPENDENT_AMBULATORY_CARE_PROVIDER_SITE_OTHER): Payer: Self-pay | Admitting: General Surgery

## 2011-11-18 ENCOUNTER — Encounter: Payer: Self-pay | Admitting: *Deleted

## 2011-11-18 NOTE — Telephone Encounter (Signed)
Pt called to ask for "referral" be sent for her to attend the Monday, 11/21/11, [facility] class if OK with CS.  Please advise.

## 2011-11-18 NOTE — Telephone Encounter (Signed)
Streck signed release and it has been faxed to [facility] class. Patient aware.

## 2011-11-18 NOTE — Progress Notes (Signed)
Received Oncotype Dx results of 18.  Gave copy to MD.  Took copy to Med Rec to scan. 

## 2011-11-21 ENCOUNTER — Telehealth (INDEPENDENT_AMBULATORY_CARE_PROVIDER_SITE_OTHER): Payer: Self-pay

## 2011-11-21 ENCOUNTER — Encounter: Payer: Self-pay | Admitting: *Deleted

## 2011-11-21 NOTE — Telephone Encounter (Signed)
They need a note faxed stating the pt is clear to attend the [facility] class.  Fax to [phone]

## 2011-11-21 NOTE — Progress Notes (Signed)
FUNC Right Breast Cancer, Stage I,(T1,NO, MO) Right Lumpectomy 10/28/11= Right UOQ : Invasive ductal Carcinoma/DCIS w/calcifications, ER/PR=positive/ Has past hx Breast Cancer and Thyroid disease  Alert,oriented x3, Married, 2 sons,  Right breast incision well healed still has glue over incision, soreness where incision is Menses 60, age first pregnancy, G2 P2 HRT 6 months, stopped  Years ago No c/o pain    Allergies:NKDA

## 2011-11-21 NOTE — Telephone Encounter (Signed)
This was faxed on Friday 11/18/11 to this number. Documented in previous phone note.

## 2011-11-22 ENCOUNTER — Ambulatory Visit
Admission: RE | Admit: 2011-11-22 | Discharge: 2011-11-22 | Disposition: A | Discharge status: Home / Self Care | Payer: BC Managed Care – PPO | Source: Ambulatory Visit | Attending: Radiation Oncology | Admitting: Radiation Oncology

## 2011-11-22 ENCOUNTER — Encounter: Payer: Self-pay | Admitting: Radiation Oncology

## 2011-11-22 VITALS — BP 136/57 | HR 75 | Temp 99.5°F | Resp 20 | Wt 138.9 lb

## 2011-11-22 DIAGNOSIS — Z510 Encounter for antineoplastic radiation therapy: Secondary | ICD-10-CM | POA: Insufficient documentation

## 2011-11-22 DIAGNOSIS — C50419 Malignant neoplasm of upper-outer quadrant of unspecified female breast: Secondary | ICD-10-CM

## 2011-11-22 DIAGNOSIS — C50919 Malignant neoplasm of unspecified site of unspecified female breast: Secondary | ICD-10-CM | POA: Insufficient documentation

## 2011-11-22 HISTORY — DX: Anxiety disorder, unspecified: F41.9

## 2011-11-22 HISTORY — DX: Reserved for inherently not codable concepts without codable children: IMO0001

## 2011-11-22 HISTORY — DX: Encounter for other specified aftercare: Z51.89

## 2011-11-22 NOTE — Progress Notes (Signed)
Please see the Nurse Progress Note in the MD Initial Consult Encounter for this patient. 

## 2011-11-22 NOTE — Progress Notes (Signed)
Followup note:  Diagnosis: Stage IA (T1 B. N0 M0) invasive ductal/DCIS of the right breast  Dominique Adams returns today for review. I initially saw her at the [facility] in consultation on 10/12/2011. She presented with a 1 cm irregular density with internal fine pleomorphic calcifications within the right upper outer quadrant. Stereotactic needle biopsy was diagnostic for invasive mammary/ductal carcinoma along with DCIS with associated calcifications. She underwent a right partial mastectomy and sentinel lymph node biopsy. Dr. Jamey Ripa went down to the fascia and took additional tissue superiorly based on the specimen radiograph.  Everything was done through one incision. She was found have a 0.9 cm invasive ductal carcinoma with DCIS. Invasive carcinoma was focally within 0.2 cm for the posterior margin and DCIS was seen focally 0.3 cm a posterior margin. The additional tissue taken superiorly was benign. 4 lymph nodes were free of metastatic disease. Her tumor was strongly ER positive at 100% and PR positive at 95% with a low proliferation index of 10%. Oncotype DX score was 18. She is scheduled see Dr. Donnie Coffin for a followup visit next Monday, September 23. She is without complaints today.  Physical examination: Alert and oriented. She appears to be much younger than her stated age of 59. Wt Readings from Last 3 Encounters:  11/22/11 138 lb 14.4 oz (63.005 kg)  11/15/11 138 lb (62.596 kg)  10/28/11 138 lb (62.596 kg)   Temp Readings from Last 3 Encounters:  11/22/11 99.5 F (37.5 C) Oral  10/28/11 97.7 F (36.5 C)   10/28/11 97.7 F (36.5 C)    BP Readings from Last 3 Encounters:  11/22/11 136/57  11/15/11 130/70  10/28/11 136/63   Pulse Readings from Last 3 Encounters:  11/22/11 75  11/15/11 76  10/28/11 68   Head and neck examination: Grossly unremarkable. Nodes: Without palpable cervical, supraclavicular, or axillary lymphadenopathy. Chest: Lungs clear. Breasts: There is a partial  mastectomy wound along the upper-outer quadrant of the right breast extending from 10 to 11:00, diagonally. No masses are appreciated. Left breast without masses or lesions. Extremities: Without edema.  Laboratory data: Lab Results  Component Value Date   WBC 4.7 10/12/2011   HGB 11.5* 10/28/2011   HCT 36.5 10/12/2011   MCV 78.9* 10/12/2011   PLT 275 10/12/2011    Impression: Stage I (T1b  N0 M0) invasive ductal/DCIS of the right breast. She is a candidate for breast preservation. I do not feel strongly about obtaining a baseline right breast mammogram to confirm removal of all suspicious microcalcifications since they appeared to be confined to the mass/density seen on mammography. Her Oncotype DX score appears to be favorable and she'll meet with Dr. Donnie Coffin next Monday to discuss probable adjuvant hormone therapy following breast radiation. I will tentatively have her scheduled for summation/treatment planning after she sees Dr. Donnie Coffin next Monday. We discussed the potential acute and late toxicities of radiation therapy. Consent is signed today. She'll be treated with hypo-fractionated radiation over a period of 3-1/2-4 weeks.  Plan: As discussed above.  30 minutes was spent face-to-face with the patient, primarily counseling the patient.

## 2011-11-28 ENCOUNTER — Other Ambulatory Visit (HOSPITAL_BASED_OUTPATIENT_CLINIC_OR_DEPARTMENT_OTHER): Payer: BC Managed Care – PPO | Admitting: Lab

## 2011-11-28 ENCOUNTER — Other Ambulatory Visit: Payer: BC Managed Care – PPO | Admitting: Lab

## 2011-11-28 ENCOUNTER — Ambulatory Visit
Admission: RE | Admit: 2011-11-28 | Discharge: 2011-11-28 | Disposition: A | Discharge status: Home / Self Care | Payer: BC Managed Care – PPO | Source: Ambulatory Visit | Attending: Radiation Oncology | Admitting: Radiation Oncology

## 2011-11-28 ENCOUNTER — Encounter: Payer: BC Managed Care – PPO | Admitting: Genetic Counselor

## 2011-11-28 ENCOUNTER — Ambulatory Visit (HOSPITAL_BASED_OUTPATIENT_CLINIC_OR_DEPARTMENT_OTHER): Payer: BC Managed Care – PPO | Admitting: Oncology

## 2011-11-28 ENCOUNTER — Telehealth: Payer: Self-pay | Admitting: *Deleted

## 2011-11-28 VITALS — BP 125/73 | HR 67 | Temp 99.1°F | Resp 20 | Ht 64.5 in | Wt 139.5 lb

## 2011-11-28 DIAGNOSIS — C50919 Malignant neoplasm of unspecified site of unspecified female breast: Secondary | ICD-10-CM

## 2011-11-28 DIAGNOSIS — C50419 Malignant neoplasm of upper-outer quadrant of unspecified female breast: Secondary | ICD-10-CM

## 2011-11-28 DIAGNOSIS — Z170 Estrogen receptor positive status [ER+]: Secondary | ICD-10-CM

## 2011-11-28 LAB — COMPREHENSIVE METABOLIC PANEL (CC13)
ALT: 10 U/L (ref 0–55)
Albumin: 3.7 g/dL (ref 3.5–5.0)
Alkaline Phosphatase: 59 U/L (ref 40–150)
CO2: 28 mEq/L (ref 22–29)
Glucose: 95 mg/dl (ref 70–99)
Potassium: 4.2 mEq/L (ref 3.5–5.1)
Sodium: 141 mEq/L (ref 136–145)
Total Bilirubin: 0.5 mg/dL (ref 0.20–1.20)
Total Protein: 7.1 g/dL (ref 6.4–8.3)

## 2011-11-28 LAB — CBC WITH DIFFERENTIAL/PLATELET
BASO%: 0.8 % (ref 0.0–2.0)
Eosinophils Absolute: 0 10*3/uL (ref 0.0–0.5)
LYMPH%: 37.3 % (ref 14.0–49.7)
MCHC: 34.1 g/dL (ref 31.5–36.0)
MONO#: 0.6 10*3/uL (ref 0.1–0.9)
NEUT#: 2.4 10*3/uL (ref 1.5–6.5)
RBC: 4.51 10*6/uL (ref 3.70–5.45)
RDW: 15.2 % — ABNORMAL HIGH (ref 11.2–14.5)
WBC: 5 10*3/uL (ref 3.9–10.3)

## 2011-11-28 NOTE — Telephone Encounter (Signed)
MADE PATIENT APPOINTMENT FOR 01-04-2012 STARTING AT 3:00PM

## 2011-11-28 NOTE — Progress Notes (Signed)
Simulation/treatment planning note: The patient was taken to the CT simulator. She was placed on a custom breast board with a custom neck mold for immobilization. Her right breast field borders were marked with radiopaque wires. Her partial mastectomy scar was also marked with a radiopaque wire. She was then scanned. I contoured her tumor bed. In addition, she had contouring of her right partial mastectomy scar and normal surrounding anatomy. She was set up to medial and lateral right breast tangents. 2 sets of multileaf collimators are constructed to shield the normal surrounding structures. I'm prescribing 4250 cGy in 17 sessions followed by electron beam boost to her right breast tumor bed for 750 cGy in 3 sessions. An isodose plan and dosimetry are requested.

## 2011-11-28 NOTE — Progress Notes (Signed)
Dominique Adams [id number] April 21, 1950 61 y.o. 11/28/2011 1:14 PM  CC  Kari Baars, MD [address] [place], [place]. [place] [address]  REASON FOR CONSULTATION:  Breast cancer  Patient was seen in the Multidisciplinary Breast Clinic for discussion of her treatment options. She was seen by Dr. Pierce Crane, Radiation [facility]  STAGE:   Cancer of upper-outer quadrant of female breast   Primary site: Breast (Right)   Staging method: AJCC 7th Edition   Clinical: Stage IA (T1a, N0, cM0)   Summary: Stage IA (T1a, N0, cM0)  REFERRING PHYSICIAN: Dr. Eric Form  HISTORY OF PRESENT ILLNESS:  Dominique Adams is a 61 y.o. female.  From [facility] who is referred for evaluation of breast cancer. She has undergone annual screening mammography. Screening mammogram performed 09/16/2011 showed calcifications in the right breast. A diagnostic mammogram and ultrasound from 10/04/2011 confirmed an area of pleomorphic calcifications her right upper outer quadrant. A biopsy was recommended and took place on 10/04/2011. This showed invasive ductal cancer, grade 1 ER +100%, PR +95%, proliferative index 10% and HER-2 nonamplified the ratio 1.35. The patient was not have an MRI scan of both breasts and 10/10/2011 and this confirmed the presence of a 1.9 x 1.9 x 1.8 cm area of abnormality associated with clip artifact. No other abnormalities were detected. Of note that was a 7 mm intramammary lymph node which appeared to be normal.   Past Medical History: Past Medical History  Diagnosis Date  . Thyroid disease   . Dysrhythmia     used to see dr tennant-he put her on meds-released her 2011  . Arthritis   . Wears glasses   . Breast cancer 10/04/11    right breast=invasive mammary ca,ductal ca in situ w/calcifications,ER/PR=Positive  . Blood transfusion     no surgery  . Anxiety    tachycardia on beta blocker  Past Surgical  History Past Surgical History  Procedure Date  . Cystectomy 1995    lt ankle  . Right breast bx 10/04/11    righy uoq bx=invasive mammary ca,dcis w calcifications  . Right breast lumpectomy 10/28/11    right,invasive ductal ca,dcis,margins not involved,1 benign lympgh node axilla  . Colonoscopy 2009    neg    Family History: Family History  Problem Relation Age of Onset  . Arrhythmia Father 25  . Hypertension Father   . Arrhythmia Mother 1  . Hypertension Mother     chf  . Diabetes Mother   . Diabetes Father   . Cancer Mother     lung  . Cancer Father age 110      prostate ca    Social History History  Substance Use Topics  . Smoking status: Never Smoker   . Smokeless tobacco: Not on file  . Alcohol Use: No    Allergies: No Known Allergies  Current Medications: Current Outpatient Prescriptions  Medication Sig Dispense Refill  . Calcium Carb-Cholecalciferol (CALCIUM-VITAMIN D3) 600-500 MG-UNIT CAPS Take 1,500 mg by mouth daily.       . fish oil-omega-3 fatty acids 1000 MG capsule Take 2 g by mouth daily.      Marland Kitchen ibuprofen (ADVIL,MOTRIN) 200 MG tablet Take 200 mg by mouth every 6 (six) hours as needed.      Marland Kitchen levothyroxine (SYNTHROID, LEVOTHROID) 50 MCG tablet Take 50 mcg by mouth daily.      . metoprolol succinate (TOPROL-XL) 25 MG 24 hr tablet Take 25 mg by mouth  daily. Takes 12.5mg  now stated patient 11/22/11      . PARoxetine (PAXIL) 20 MG tablet Take 20 mg by mouth every morning.        OB/GYN History: G2 P2, menarche age 42, history of hormone replacement therapy first 2006 months since at 2003 discontinued approximately 32,011, age of first birth 55  Fertility Discussion: NA Prior History of Cancer: No  Health Maintenance:  Colonoscopy yes Bone Density yes Last PAP smear 09/15/2011  ECOG PERFORMANCE STATUS: 0 - Asymptomatic  Genetic Counseling/testing: No  REVIEW OF SYSTEMS:  A comprehensive review of systems was negative.  PHYSICAL  EXAMINATION: Blood pressure 125/73, pulse 67, temperature 99.1 F (37.3 C), temperature source Oral, resp. rate 20, height 5' 4.5" (1.638 m), weight 139 lb 8 oz (63.277 kg).  HEENT:  Sclerae anicteric, conjunctivae pink.  Oropharynx clear.  No mucositis or candidiasis.  Nodes:  No cervical, supraclavicular, or axillary lymphadenopathy palpated.  Breast Exam:  Right breast is status post I..  No masses, discharge, skin change, or nipple inversion.  Left breast is benign.  No masses, discharge, skin change, or nipple inversion..  Lungs:  Clear to auscultation bilaterally.  No crackles, rhonchi, or wheezes.  Heart:  Regular rate and rhythm.  Abdomen:  Soft, nontender.  Positive bowel sounds.  No organomegaly or masses palpated.  Musculoskeletal:  No focal spinal tenderness to palpation.  Extremities:  Benign.  No peripheral edema or cyanosis.  Skin:  Benign.  Neuro:  Nonfocal.      STUDIES/RESULTS: Mr Breast Bilateral W Wo Contrast  10/10/2011  *RADIOLOGY REPORT*  Clinical Data: Recently diagnosed right breast invasive mammary carcinoma with lobular features and DCIS.  Preoperative evaluation.  BUN and creatinine were obtained on site at [facility] at [address] Results:  BUN 11 mg/dL,  Creatinine 0.9 mg/dL.  BILATERAL BREAST MRI WITH AND WITHOUT CONTRAST  Technique: Multiplanar, multisequence MR images of both breasts were obtained prior to and following the intravenous administration of 13ml of Multihance.  Three dimensional images were evaluated at the independent DynaCad workstation.  Comparison:  Mammograms dated 10/04/2011, 09/16/2011, 08/16/2010, 01/08/2010, 07/24/2009, 07/17/2009, 07/16/2008, 07/12/2007.  Findings: There is a small post biopsy hematoma with mild rim enhancement located superiorly within the upper-outer quadrant of the right breast at approximately the 11 o'clock position. This measures 1.9 x 1.9 x 1.8 cm in size and is associated with central clip artifact or  corresponding to the recently biopsied invasive mammary carcinoma with DCIS.  Located 5 mm posterior to the post biopsy change  is a stable  intramammary lymph node which measures 7 mm in size.  There are multiple nearby intramammary lymph nodes located within the upper-outer quadrant right breast which also appear stable mammographically. There are no  additional worrisome enhancing foci within either breast.  There are no worrisome axillary or internal mammary lymph nodes.  There are no additional findings.  IMPRESSION: 1.9 cm in diameter area of enhancement consistent with post biopsy change and residual DCIS  with central clip artifact located within the upper-outer quadrant right breast at the 11 o'clock position. There is a 7 mm in size intramammary lymph node seen  5 mm posterior to this biopsy site.  However, this appears stable when compared to prior mammograms as do multiple adjacent intramammary lymph nodes within the right upper-outer quadrant.  No additional worrisome enhancing foci and no additional findings.  RECOMMENDATION: Treatment plan  THREE-DIMENSIONAL MR IMAGE RENDERING ON INDEPENDENT WORKSTATION:  Three-dimensional MR images  were rendered by post-processing of the original MR data on an independent workstation.  The three- dimensional MR images were interpreted, and findings were reported in the accompanying complete MRI report for this study.  BI-RADS CATEGORY 6:  Known biopsy-proven malignancy - appropriate action should be taken.  Original Report Authenticated By: Rolla Plate, M.D.   Mm Breast Stereo Biopsy Right  10/04/2011  *RADIOLOGY REPORT*  Clinical Data:  Suspicious right upper outer quadrant calcifications  STEREOTACTIC-GUIDED VACUUM ASSISTED BIOPSY OF THE RIGHT BREAST AND SPECIMEN RADIOGRAPH  Comparison: Previous exams  I met with the patient and we discussed the procedure of stereotactic-guided biopsy, including benefits and alternatives. We discussed the high likelihood of  a successful procedure. We discussed the risks of the procedure, including infection, bleeding, tissue injury, clip migration, and inadequate sampling. Informed, written consent was given.  Using sterile technique, 2% lidocaine, stereotactic guidance, and a 9 gauge vacuum assisted device, biopsy was performed of right breast calcifications in the upper outer quadrant using a lateral to medial approach.  Specimen radiograph was performed, showing calcifications in the biopsy samples.  Specimens with calcifications are identified for pathology.  At the conclusion of the procedure, a tissue marker clip was deployed into the biopsy cavity.  Follow-up 2-view mammogram confirmed clip placement at the biopsy site.  IMPRESSION: Stereotactic-guided biopsy of right upper outer quadrant calcifications, with T shaped clip placement.  Pathology is pending.  No apparent complications.  Original Report Authenticated By: Harrel Lemon, M.D.   Mm Breast Surgical Specimen  10/04/2011  *RADIOLOGY REPORT*  Clinical Data:  Suspicious right upper outer quadrant calcifications  STEREOTACTIC-GUIDED VACUUM ASSISTED BIOPSY OF THE RIGHT BREAST AND SPECIMEN RADIOGRAPH  Comparison: Previous exams  I met with the patient and we discussed the procedure of stereotactic-guided biopsy, including benefits and alternatives. We discussed the high likelihood of a successful procedure. We discussed the risks of the procedure, including infection, bleeding, tissue injury, clip migration, and inadequate sampling. Informed, written consent was given.  Using sterile technique, 2% lidocaine, stereotactic guidance, and a 9 gauge vacuum assisted device, biopsy was performed of right breast calcifications in the upper outer quadrant using a lateral to medial approach.  Specimen radiograph was performed, showing calcifications in the biopsy samples.  Specimens with calcifications are identified for pathology.  At the conclusion of the procedure, a tissue  marker clip was deployed into the biopsy cavity.  Follow-up 2-view mammogram confirmed clip placement at the biopsy site.  IMPRESSION: Stereotactic-guided biopsy of right upper outer quadrant calcifications, with T shaped clip placement.  Pathology is pending.  No apparent complications.  Original Report Authenticated By: Harrel Lemon, M.D.   Mm Digital Diag Ltd R  10/04/2011  *RADIOLOGY REPORT*  Clinical Data:  Screening callback for questioned right breast calcifications  DIGITAL DIAGNOSTIC RIGHT MAMMOGRAM WITHOUT CAD  Comparison:  Prior exams  Findings:  Additional views confirm a 1 cm area of irregular density with associated internal fine pleomorphic calcifications in the right upper outer quadrant.  This corresponds to the mammographic finding.  IMPRESSION: Suspicious right upper outer quadrant calcifications.  Stereotactic core needle biopsy will be performed and dictated separately. Findings and recommendations discussed with the patient and provided in written form at the time of the exam.  BI-RADS CATEGORY 4:  Suspicious abnormality - biopsy should be considered.  Recommendation:  Right stereotactic core needle biopsy  Original Report Authenticated By: Harrel Lemon, M.D.   Mm Digital Screening  09/19/2011  *RADIOLOGY REPORT*  Clinical  Data: Screening.  DIGITAL SCREENING MAMMOGRAM WITH CAD  Comparison:  Previous exams  Findings: Two views of each breast demonstrate scattered fibroglandular densities.  In the right breast, calcifications warrant further evaluation with magnified views.  In the left breast, no mass or malignant type calcifications are identified.  Images were processed with CAD.  IMPRESSION: Further evaluation is suggested for calcifications in the right breast.  RECOMMENDATION: Diagnostic mammogram of the right breast. (Code:FI-R-80M)  BI-RADS CATEGORY 0:  Incomplete.  Need additional imaging evaluation and/or prior mammograms for comparison.  Original Report Authenticated  By: Hiram Gash, M.D.   Mm Radiologist Eval And Mgmt  10/05/2011  *RADIOLOGY REPORT*  ESTABLISHED PATIENT OFFICE VISIT - LEVEL II [id number])  Chief Complaint:  The patient presents with her sister for discussion of pathology results after stereotactic core needle biopsy of the right breast on 10/04/2011.  History:  Calcifications were detected at screening mammography in the right upper outer quadrant and stereotactic core needle biopsy was performed.  Exam:  The incision is clean and dry without ecchymosis or hematoma.  Pathology: Pathology demonstrates invasive mammary carcinoma and DCIS, which is concordant with the imaging appearance.  Assessment and Plan:  Preoperative breast MRI is scheduled 10/10/2011 at 8:30 a.m. arrival time.  Multidisciplinary breast cancer clinic appointment is scheduled 10/12/2011.  All questions were answered.  The patient reports no problems at the biopsy site overnight.  Original Report Authenticated By: Harrel Lemon, M.D.     LABS:    Chemistry      Component Value Date/Time   NA 141 11/28/2011 1140   NA 138 10/26/2011 1500   K 4.2 11/28/2011 1140   K 4.9 10/26/2011 1500   CL 105 11/28/2011 1140   CL 101 10/26/2011 1500   CO2 28 11/28/2011 1140   CO2 31 10/26/2011 1500   BUN 16.0 11/28/2011 1140   BUN 14 10/26/2011 1500   CREATININE 0.8 11/28/2011 1140   CREATININE 0.76 10/26/2011 1500      Component Value Date/Time   CALCIUM 9.7 11/28/2011 1140   CALCIUM 10.0 10/26/2011 1500   ALKPHOS 59 11/28/2011 1140   ALKPHOS 59 10/12/2011 0846   AST 18 11/28/2011 1140   AST 18 10/12/2011 0846   ALT 10 11/28/2011 1140   ALT 9 10/12/2011 0846   BILITOT 0.50 11/28/2011 1140   BILITOT 0.4 10/12/2011 0846      Lab Results  Component Value Date   WBC 5.0 11/28/2011   HGB 12.2 11/28/2011   HCT 35.9 11/28/2011   MCV 79.6 11/28/2011   PLT 271 11/28/2011       PATHOLOGY: Grade 1 ER/PR positive HER-2 negative ductal carcinoma  ASSESSMENT    We had a fairly lengthy  discussion regarding her past holiday and expected treatment. I indicated that she is a great candidate for lumpectomy and will see her afterwards to discuss adjuvant therapy. I also discussed the Oncotype test with her. I expect that this dictation will be sent for this to determine whether she requires chemotherapy. She is an excellent candidate for adjuvant hormonal therapy. I also discussed that with her. We will likely get a up-to-date bone density test prior to starting the hormonal therapy.  Clinical Trial Eligibility: No Multidisciplinary conference discussion yes    PLAN:    Plan is as outlined lumpectomy, sentinel lymph node evaluation followed by submission of tissue for Oncotype testing and then evaluation for possible adjuvant therapy. That with chemotherapy. She is an excellent candidate for hormonal  therapy. Followup in in 3 weeks       Discussion: Patient is being treated per NCCN breast cancer care guidelines appropriate for stage. I   Thank you so much for allowing me to participate in the care of Dominique Adams. I will continue to follow up the patient with you and assist in her care.  All questions were answered. The patient knows to call the clinic with any problems, questions or concerns. We can certainly see the patient much sooner if necessary.  I spent 40 minutes counseling the patient face to face. The total time spent in the appointment was 20 minutes.      Pierce Crane M.D. Brandon Melnick 11/28/2011, 1:14 PM     Hematology and Oncology Follow Up Visit  Dominique Adams [id number] 06-01-1950 61 y.o. 11/29/2011 12:03 AM   DIAGNOSIS:   Encounter Diagnosis  Name Primary?  . Breast cancer Yes     PAST THERAPY:  ER/PR positive breast cancer T1 BN 0  Interim History:  Patient is here for followup. Her Oncotype score came back with a score of 18 and a risk of recurrence about 12%. She is due to start simulation today. We discussed the risks and benefits of  going ahead with radiation and tamoxifen therapy. She is careful with this. She will likely receive an AI after radiation. She'll need to get an up-to-date bone density test. Medications: I have reviewed the patient's current medications.  Allergies: No Known Allergies  Past Medical History, Surgical history, Social history, and Family History were reviewed and updated.  Review of Systems: Constitutional:  Negative for fever, chills, night sweats, anorexia, weight loss, pain. Cardiovascular: no chest pain or dyspnea on exertion Respiratory: no cough, shortness of breath, or wheezing Neurological: negative Dermatological: negative ENT: negative Skin Gastrointestinal: no abdominal pain, change in bowel habits, or black or bloody stools Genito-Urinary: no dysuria, trouble voiding, or hematuria Hematological and Lymphatic: negative Breast: negative for breast lumps Musculoskeletal: negative Remaining ROS negative.  Physical Exam:  Blood pressure 125/73, pulse 67, temperature 99.1 F (37.3 C), temperature source Oral, resp. rate 20, height 5' 4.5" (1.638 m), weight 139 lb 8 oz (63.277 kg).  ECOG:  0     Lab Results: Lab Results  Component Value Date   WBC 5.0 11/28/2011   HGB 12.2 11/28/2011   HCT 35.9 11/28/2011   MCV 79.6 11/28/2011   PLT 271 11/28/2011     Chemistry      Component Value Date/Time   NA 141 11/28/2011 1140   NA 138 10/26/2011 1500   K 4.2 11/28/2011 1140   K 4.9 10/26/2011 1500   CL 105 11/28/2011 1140   CL 101 10/26/2011 1500   CO2 28 11/28/2011 1140   CO2 31 10/26/2011 1500   BUN 16.0 11/28/2011 1140   BUN 14 10/26/2011 1500   CREATININE 0.8 11/28/2011 1140   CREATININE 0.76 10/26/2011 1500      Component Value Date/Time   CALCIUM 9.7 11/28/2011 1140   CALCIUM 10.0 10/26/2011 1500   ALKPHOS 59 11/28/2011 1140   ALKPHOS 59 10/12/2011 0846   AST 18 11/28/2011 1140   AST 18 10/12/2011 0846   ALT 10 11/28/2011 1140   ALT 9 10/12/2011 0846   BILITOT 0.50 11/28/2011  1140   BILITOT 0.4 10/12/2011 0846       Radiological Studies:  No results found.   IMPRESSIONS AND PLAN: A 61 y.o. female with   Node-negative ER/PR positive breast cancer low/intermediate  Oncotype score, I will plan to see the patient after she completes her radiation therapy to discuss hormonal therapy with her.  Spent more than half the time coordinating care,      Dominique Adams 9/24/201312:03 AM Cell [id number]

## 2011-12-05 ENCOUNTER — Ambulatory Visit
Admission: RE | Admit: 2011-12-05 | Discharge: 2011-12-05 | Disposition: A | Discharge status: Home / Self Care | Payer: BC Managed Care – PPO | Source: Ambulatory Visit | Attending: Radiation Oncology | Admitting: Radiation Oncology

## 2011-12-05 DIAGNOSIS — C50419 Malignant neoplasm of upper-outer quadrant of unspecified female breast: Secondary | ICD-10-CM

## 2011-12-05 NOTE — Progress Notes (Signed)
Similar to verification note: Ms. Dominique Adams underwent similar to verification for treatment to her right breast. Her isocenter is in good position and the multileaf collimators contoured the treatment volume appropriately.

## 2011-12-05 NOTE — Addendum Note (Signed)
Encounter addended by: Rosie Golson Mintz Ticara Waner, RN on: 12/05/2011  7:43 PM<BR>     Documentation filed: Charges VN

## 2011-12-06 ENCOUNTER — Ambulatory Visit: Payer: BC Managed Care – PPO

## 2011-12-07 ENCOUNTER — Ambulatory Visit
Admission: RE | Admit: 2011-12-07 | Discharge: 2011-12-07 | Disposition: A | Discharge status: Home / Self Care | Payer: BC Managed Care – PPO | Source: Ambulatory Visit | Attending: Radiation Oncology | Admitting: Radiation Oncology

## 2011-12-07 DIAGNOSIS — C50419 Malignant neoplasm of upper-outer quadrant of unspecified female breast: Secondary | ICD-10-CM

## 2011-12-07 MED ORDER — RADIAPLEXRX EX GEL
Freq: Once | CUTANEOUS | Status: AC
  Administered 2011-12-07: 12:00:00 via TOPICAL

## 2011-12-07 MED ORDER — ALRA NON-METALLIC DEODORANT (RAD-ONC)
1.0000 "application " | Freq: Once | TOPICAL | Status: AC
  Administered 2011-12-07: 1 via TOPICAL

## 2011-12-07 NOTE — Progress Notes (Signed)
Post sim teaching radiation therapy and you book, alra and radiaplex gel given to patient with business card, schedule, teacjh back given

## 2011-12-08 ENCOUNTER — Ambulatory Visit
Admission: RE | Admit: 2011-12-08 | Discharge: 2011-12-08 | Disposition: A | Discharge status: Home / Self Care | Payer: BC Managed Care – PPO | Source: Ambulatory Visit | Attending: Radiation Oncology | Admitting: Radiation Oncology

## 2011-12-09 ENCOUNTER — Ambulatory Visit
Admission: RE | Admit: 2011-12-09 | Discharge: 2011-12-09 | Disposition: A | Discharge status: Home / Self Care | Payer: BC Managed Care – PPO | Source: Ambulatory Visit | Attending: Radiation Oncology | Admitting: Radiation Oncology

## 2011-12-12 ENCOUNTER — Ambulatory Visit
Admission: RE | Admit: 2011-12-12 | Discharge: 2011-12-12 | Disposition: A | Discharge status: Home / Self Care | Payer: BC Managed Care – PPO | Source: Ambulatory Visit | Attending: Radiation Oncology | Admitting: Radiation Oncology

## 2011-12-12 ENCOUNTER — Encounter: Payer: Self-pay | Admitting: Radiation Oncology

## 2011-12-12 VITALS — BP 119/63 | HR 73 | Temp 98.3°F | Resp 20 | Wt 140.0 lb

## 2011-12-12 DIAGNOSIS — C50419 Malignant neoplasm of upper-outer quadrant of unspecified female breast: Secondary | ICD-10-CM

## 2011-12-12 NOTE — Progress Notes (Signed)
Patient here weekly rad txs,complted 5/20 right breast, using radiaplex faily, no c/o pain, no skin changes as yet 2:42 PM

## 2011-12-12 NOTE — Progress Notes (Signed)
Weekly Management Note:  Site:R Breast Current Dose:  1250  cGy Projected Dose: 5000  cGy  Narrative: The patient is seen today for routine under treatment assessment. CBCT/MVCT images/port films were reviewed. The chart was reviewed.   No complaints today. She uses Radioplex gel.  Physical Examination:  Filed Vitals:   12/12/11 1440  BP: 119/63  Pulse: 73  Temp: 98.3 F (36.8 C)  Resp: 20  .  Weight: 140 lb (63.504 kg). No significant skin changes  Impression: Tolerating radiation therapy well.  Plan: Continue radiation therapy as planned.

## 2011-12-13 ENCOUNTER — Ambulatory Visit
Admission: RE | Admit: 2011-12-13 | Discharge: 2011-12-13 | Disposition: A | Discharge status: Home / Self Care | Payer: BC Managed Care – PPO | Source: Ambulatory Visit | Attending: Radiation Oncology | Admitting: Radiation Oncology

## 2011-12-13 ENCOUNTER — Encounter: Payer: Self-pay | Admitting: Radiation Oncology

## 2011-12-13 NOTE — Progress Notes (Signed)
Complex simulation note: The patient underwent virtual simulation for her right breast boost with electrons. She was set up en face. One custom block was constructed to conform the field. I prescribing 750 cGy in 3 sessions utilizing 12 MEV electrons. Electron beam energy was chosen based on the depth of her tumor bed as seen on her CT scan. A special port plan was requested.

## 2011-12-14 ENCOUNTER — Ambulatory Visit
Admission: RE | Admit: 2011-12-14 | Discharge: 2011-12-14 | Disposition: A | Discharge status: Home / Self Care | Payer: BC Managed Care – PPO | Source: Ambulatory Visit | Attending: Radiation Oncology | Admitting: Radiation Oncology

## 2011-12-15 ENCOUNTER — Ambulatory Visit
Admission: RE | Admit: 2011-12-15 | Discharge: 2011-12-15 | Disposition: A | Discharge status: Home / Self Care | Payer: BC Managed Care – PPO | Source: Ambulatory Visit | Attending: Radiation Oncology | Admitting: Radiation Oncology

## 2011-12-16 ENCOUNTER — Ambulatory Visit
Admission: RE | Admit: 2011-12-16 | Discharge: 2011-12-16 | Disposition: A | Discharge status: Home / Self Care | Payer: BC Managed Care – PPO | Source: Ambulatory Visit | Attending: Radiation Oncology | Admitting: Radiation Oncology

## 2011-12-19 ENCOUNTER — Ambulatory Visit
Admission: RE | Admit: 2011-12-19 | Discharge: 2011-12-19 | Disposition: A | Discharge status: Home / Self Care | Payer: BC Managed Care – PPO | Source: Ambulatory Visit | Attending: Radiation Oncology | Admitting: Radiation Oncology

## 2011-12-19 ENCOUNTER — Encounter: Payer: Self-pay | Admitting: Radiation Oncology

## 2011-12-19 VITALS — BP 110/60 | HR 74 | Temp 98.3°F | Resp 20 | Wt 138.4 lb

## 2011-12-19 DIAGNOSIS — C50419 Malignant neoplasm of upper-outer quadrant of unspecified female breast: Secondary | ICD-10-CM

## 2011-12-19 NOTE — Progress Notes (Signed)
Weekly Management Note:  Site:R Breast Current Dose:  2500  cGy Projected Dose: 5000  cGy  Narrative: The patient is seen today for routine under treatment assessment. CBCT/MVCT images/port films were reviewed. The chart was reviewed.   No complaints today. She uses Radioplex gel.  Physical Examination:  Filed Vitals:   12/19/11 1433  BP: 110/60  Pulse: 74  Temp: 98.3 F (36.8 C)  Resp: 20  .  Weight: 138 lb 6.4 oz (62.778 kg). Faint hyperpigmentation the skin along the right breast with no areas of desquamation.  Impression: Tolerating radiation therapy well.  Plan: Continue radiation therapy as planned.

## 2011-12-19 NOTE — Progress Notes (Signed)
No c/o pain, just a few twinges occasionally yesterday, using radiaplex gel bid 2:33 PM '

## 2011-12-19 NOTE — Progress Notes (Signed)
Patient here weekly rad tx right breast:10/30 completed , slight hyperpigmentation, skin intact, nipple inverted some,been like that since surgery states patient,

## 2011-12-20 ENCOUNTER — Ambulatory Visit
Admission: RE | Admit: 2011-12-20 | Discharge: 2011-12-20 | Disposition: A | Discharge status: Home / Self Care | Payer: BC Managed Care – PPO | Source: Ambulatory Visit | Attending: Radiation Oncology | Admitting: Radiation Oncology

## 2011-12-21 ENCOUNTER — Encounter: Payer: Self-pay | Admitting: Nutrition

## 2011-12-21 ENCOUNTER — Ambulatory Visit
Admission: RE | Admit: 2011-12-21 | Discharge: 2011-12-21 | Disposition: A | Discharge status: Home / Self Care | Payer: BC Managed Care – PPO | Source: Ambulatory Visit | Attending: Radiation Oncology | Admitting: Radiation Oncology

## 2011-12-21 NOTE — Progress Notes (Signed)
Patient attended breast cancer nutrition class entitled food for your fight on Tuesday, 12/20/2011. Patient received diet education on eating healthfully during treatment for breast cancer.  

## 2011-12-22 ENCOUNTER — Ambulatory Visit
Admission: RE | Admit: 2011-12-22 | Discharge: 2011-12-22 | Disposition: A | Discharge status: Home / Self Care | Payer: BC Managed Care – PPO | Source: Ambulatory Visit | Attending: Radiation Oncology | Admitting: Radiation Oncology

## 2011-12-23 ENCOUNTER — Ambulatory Visit
Admission: RE | Admit: 2011-12-23 | Discharge: 2011-12-23 | Disposition: A | Discharge status: Home / Self Care | Payer: BC Managed Care – PPO | Source: Ambulatory Visit | Attending: Radiation Oncology | Admitting: Radiation Oncology

## 2011-12-26 ENCOUNTER — Ambulatory Visit
Admission: RE | Admit: 2011-12-26 | Discharge: 2011-12-26 | Disposition: A | Discharge status: Home / Self Care | Payer: BC Managed Care – PPO | Source: Ambulatory Visit | Attending: Radiation Oncology | Admitting: Radiation Oncology

## 2011-12-26 ENCOUNTER — Encounter: Payer: Self-pay | Admitting: Radiation Oncology

## 2011-12-26 VITALS — BP 119/60 | HR 68 | Temp 98.2°F | Resp 20 | Wt 137.7 lb

## 2011-12-26 DIAGNOSIS — C50419 Malignant neoplasm of upper-outer quadrant of unspecified female breast: Secondary | ICD-10-CM

## 2011-12-26 NOTE — Progress Notes (Signed)
Weekly Management Note:  Site: Right breast Current Dose:  3750  cGy Projected Dose: 5000  cGy  Narrative: The patient is seen today for routine under treatment assessment. CBCT/MVCT images/port films were reviewed. The chart was reviewed.   She is without complaints today. She uses Radioplex gel. Her electron beam field was checked today on the [facility].  Physical Examination:  Filed Vitals:   12/26/11 1447  BP: 119/60  Pulse: 68  Temp: 98.2 F (36.8 C)  Resp: 20  .  Weight: 137 lb 11.2 oz (62.46 kg). There is moderate hyperpigmentation the skin with no areas of desquamation.  Impression: Tolerating radiation therapy well.  Plan: Continue radiation therapy as planned.

## 2011-12-26 NOTE — Progress Notes (Signed)
Patient here weekly rad tx 15/20 completed r breast, mild hyperpigmentation to breast,skin intact, no c/o pain, energy good,  2:51 PM

## 2011-12-27 ENCOUNTER — Ambulatory Visit
Admission: RE | Admit: 2011-12-27 | Discharge: 2011-12-27 | Disposition: A | Discharge status: Home / Self Care | Payer: BC Managed Care – PPO | Source: Ambulatory Visit | Attending: Radiation Oncology | Admitting: Radiation Oncology

## 2011-12-28 ENCOUNTER — Ambulatory Visit
Admission: RE | Admit: 2011-12-28 | Discharge: 2011-12-28 | Disposition: A | Discharge status: Home / Self Care | Payer: BC Managed Care – PPO | Source: Ambulatory Visit | Attending: Radiation Oncology | Admitting: Radiation Oncology

## 2011-12-28 ENCOUNTER — Ambulatory Visit: Payer: BC Managed Care – PPO

## 2011-12-29 ENCOUNTER — Ambulatory Visit: Payer: BC Managed Care – PPO

## 2011-12-29 ENCOUNTER — Ambulatory Visit
Admission: RE | Admit: 2011-12-29 | Discharge: 2011-12-29 | Disposition: A | Discharge status: Home / Self Care | Payer: BC Managed Care – PPO | Source: Ambulatory Visit | Attending: Radiation Oncology | Admitting: Radiation Oncology

## 2011-12-30 ENCOUNTER — Ambulatory Visit: Payer: BC Managed Care – PPO

## 2011-12-30 ENCOUNTER — Ambulatory Visit
Admission: RE | Admit: 2011-12-30 | Discharge: 2011-12-30 | Disposition: A | Discharge status: Home / Self Care | Payer: BC Managed Care – PPO | Source: Ambulatory Visit | Attending: Radiation Oncology | Admitting: Radiation Oncology

## 2012-01-02 ENCOUNTER — Ambulatory Visit: Payer: BC Managed Care – PPO

## 2012-01-02 ENCOUNTER — Encounter: Payer: Self-pay | Admitting: Radiation Oncology

## 2012-01-02 ENCOUNTER — Ambulatory Visit
Admission: RE | Admit: 2012-01-02 | Discharge: 2012-01-02 | Disposition: A | Discharge status: Home / Self Care | Payer: BC Managed Care – PPO | Source: Ambulatory Visit | Attending: Radiation Oncology | Admitting: Radiation Oncology

## 2012-01-02 VITALS — BP 115/68 | HR 71 | Resp 16 | Wt 139.5 lb

## 2012-01-02 DIAGNOSIS — C50919 Malignant neoplasm of unspecified site of unspecified female breast: Secondary | ICD-10-CM

## 2012-01-02 DIAGNOSIS — C50419 Malignant neoplasm of upper-outer quadrant of unspecified female breast: Secondary | ICD-10-CM

## 2012-01-02 MED ORDER — RADIAPLEXRX EX GEL
Freq: Once | CUTANEOUS | Status: AC
  Administered 2012-01-02: 15:00:00 via TOPICAL

## 2012-01-02 NOTE — Progress Notes (Signed)
[facility] Radiation Oncology End of Treatment Note  Name:Dominique Adams  Date: 01/02/2012 ZOX:[id number] DOB:30-May-1950   Status:outpatient    CC: Kari Baars, MD  Dr. Cicero Duck  REFERRING PHYSICIAN: Dr. Cicero Duck   DIAGNOSIS: There were no encounter diagnoses.    INDICATION FOR TREATMENT: Curative   TREATMENT DATES: 12/06/2011 through 01/02/2012                          SITE/DOSE:   Right breast 4250 cGy 17 sessions right breast boost 750 cGy in 3 sessions for a cumulative dose of 5000 cGy in 20 sessions (4 weeks/hypo-fractionated)                         BEAMS/ENERGY:   6 MV photons tangential fields to the right breast. 12 MEV electrons, right breast boost                NARRATIVE:    [name] tolerated treatment well with only moderate hyperpigmentation the skin and no areas of desquamation by completion of therapy.                       PLAN: Routine followup in one month. Patient instructed to call if questions or worsening complaints in interim.

## 2012-01-02 NOTE — Progress Notes (Signed)
Weekly Management Note:  Site: Right breast Current Dose:  5000  cGy Projected Dose: 5000  cGy  Narrative: The patient is seen today for routine under treatment assessment. CBCT/MVCT images/port films were reviewed. The chart was reviewed.   She still doing well and is using Radioplex gel.  Physical Examination:  Filed Vitals:   01/02/12 1426  BP: 115/68  Pulse: 71  Resp: 16  .  Weight: 139 lb 8 oz (63.277 kg). There is mild to moderate hyperpigmentation the skin along the right breast without areas of desquamation.  Impression: Radiation therapy completed.  Plan: Followup visit in one month.

## 2012-01-02 NOTE — Progress Notes (Signed)
Received patient and her husband in the clinic today following final treatment for PUT with Dr. Dayton Scrape. Patient alert and oriented to person, place, and time. No distress noted. Steady gait noted. Pleasant affect noted. Patient denies pain at this time. Hyperpigmentation with dry desquamation under right axilla noted. Patient reports that she using radiaplex bid as directed. Encouraged patient to continue radiaplex use for next two weeks. Provided patient with addition tube of radiaplex and reinforced skin care. Also, provided patient with one month follow up appointment card, fynn flyer, and abc flyer. Reviewed all material and patient verbalized understanding. Encouraged patient to contact staff with needs.

## 2012-01-03 ENCOUNTER — Encounter (INDEPENDENT_AMBULATORY_CARE_PROVIDER_SITE_OTHER): Payer: Self-pay | Admitting: Surgery

## 2012-01-04 ENCOUNTER — Telehealth: Payer: Self-pay | Admitting: *Deleted

## 2012-01-04 ENCOUNTER — Ambulatory Visit (HOSPITAL_BASED_OUTPATIENT_CLINIC_OR_DEPARTMENT_OTHER): Payer: BC Managed Care – PPO | Admitting: Oncology

## 2012-01-04 ENCOUNTER — Other Ambulatory Visit (HOSPITAL_BASED_OUTPATIENT_CLINIC_OR_DEPARTMENT_OTHER): Payer: BC Managed Care – PPO

## 2012-01-04 VITALS — BP 100/68 | HR 82 | Temp 98.9°F | Resp 20 | Ht 64.5 in | Wt 138.6 lb

## 2012-01-04 DIAGNOSIS — E559 Vitamin D deficiency, unspecified: Secondary | ICD-10-CM

## 2012-01-04 DIAGNOSIS — C50419 Malignant neoplasm of upper-outer quadrant of unspecified female breast: Secondary | ICD-10-CM

## 2012-01-04 LAB — CBC WITH DIFFERENTIAL/PLATELET
Basophils Absolute: 0 10*3/uL (ref 0.0–0.1)
Eosinophils Absolute: 0 10*3/uL (ref 0.0–0.5)
HCT: 36.4 % (ref 34.8–46.6)
HGB: 12.4 g/dL (ref 11.6–15.9)
LYMPH%: 27.9 % (ref 14.0–49.7)
MCV: 80.2 fL (ref 79.5–101.0)
MONO%: 16.3 % — ABNORMAL HIGH (ref 0.0–14.0)
NEUT#: 2.2 10*3/uL (ref 1.5–6.5)
Platelets: 238 10*3/uL (ref 145–400)

## 2012-01-04 LAB — COMPREHENSIVE METABOLIC PANEL (CC13)
CO2: 31 mEq/L — ABNORMAL HIGH (ref 22–29)
Calcium: 9.9 mg/dL (ref 8.4–10.4)
Chloride: 103 mEq/L (ref 98–107)
Creatinine: 0.7 mg/dL (ref 0.6–1.1)
Glucose: 88 mg/dl (ref 70–99)
Total Bilirubin: 0.28 mg/dL (ref 0.20–1.20)
Total Protein: 7.5 g/dL (ref 6.4–8.3)

## 2012-01-04 LAB — LACTATE DEHYDROGENASE (CC13): LDH: 130 U/L (ref 125–220)

## 2012-01-04 MED ORDER — ANASTROZOLE 1 MG PO TABS: 1.0000 mg | ORAL_TABLET | Freq: Every day | ORAL | Status: DC

## 2012-01-04 NOTE — Telephone Encounter (Signed)
Gave patient appointment for 04-10-2012

## 2012-01-04 NOTE — Progress Notes (Signed)
Hematology and Oncology Follow Up Visit  Dominique Adams [id number] 10/12/1950 61 y.o. 01/04/2012 7:59 PM   DIAGNOSIS:   Encounter Diagnoses  Name Primary?  . Cancer of upper-outer quadrant of female breast Yes  . Unspecified vitamin D deficiency    T1 B. N0 ER/PR positive breast cancer status post lumpectomy 10/28/2011  PAST THERAPY:   Radiation therapy completed 01/02/2012 Interim History:  Patient returns for followup. She completed radiation therapy. She did have a Oncotype score to check which came back with a score of 18 corresponding to a risk of recurrence of 11%. She has completed radiation therapy. It is doing fairly well. She really has no other complaints.  Medications: I have reviewed the patient's current medications.  Allergies: No Known Allergies  Past Medical History, Surgical history, Social history, and Family History were reviewed and updated.  Review of Systems: Constitutional:  Negative for fever, chills, night sweats, anorexia, weight loss, pain. Cardiovascular: negative Respiratory: negative Neurological: negative Dermatological: negative ENT: negative Skin Gastrointestinal: negative Genito-Urinary: negative Hematological and Lymphatic: negative Breast: negative Musculoskeletal: negative Remaining ROS negative.  Physical Exam:  Blood pressure 100/68, pulse 82, temperature 98.9 F (37.2 C), temperature source Oral, resp. rate 20, height 5' 4.5" (1.638 m), weight 138 lb 9.6 oz (62.869 kg).  ECOG: 0   HEENT:  Sclerae anicteric, conjunctivae pink.  Oropharynx clear.  No mucositis or candidiasis.  Nodes:  No cervical, supraclavicular, or axillary lymphadenopathy palpated.  Breast Exam:  Right breast is status post lumpectomy with radiation-related changes .  Left breast is benign.  No masses, discharge, skin change, or nipple inversion..  Lungs:  Clear to auscultation bilaterally.  No crackles, rhonchi, or wheezes.  Heart:  Regular rate and rhythm.   Abdomen:  Soft, nontender.  Positive bowel sounds.  No organomegaly or masses palpated.  Musculoskeletal:  No focal spinal tenderness to palpation.  Extremities:  Benign.  No peripheral edema or cyanosis.  Skin:  Benign.  Neuro:  Nonfocal.    Lab Results: Lab Results  Component Value Date   WBC 4.0 01/04/2012   HGB 12.4 01/04/2012   HCT 36.4 01/04/2012   MCV 80.2 01/04/2012   PLT 238 01/04/2012     Chemistry      Component Value Date/Time   NA 140 01/04/2012 1529   NA 138 10/26/2011 1500   K 4.4 01/04/2012 1529   K 4.9 10/26/2011 1500   CL 103 01/04/2012 1529   CL 101 10/26/2011 1500   CO2 31* 01/04/2012 1529   CO2 31 10/26/2011 1500   BUN 15.0 01/04/2012 1529   BUN 14 10/26/2011 1500   CREATININE 0.7 01/04/2012 1529   CREATININE 0.76 10/26/2011 1500      Component Value Date/Time   CALCIUM 9.9 01/04/2012 1529   CALCIUM 10.0 10/26/2011 1500   ALKPHOS 66 01/04/2012 1529   ALKPHOS 59 10/12/2011 0846   AST 20 01/04/2012 1529   AST 18 10/12/2011 0846   ALT 10 01/04/2012 1529   ALT 9 10/12/2011 0846   BILITOT 0.28 01/04/2012 1529   BILITOT 0.4 10/12/2011 0846       Radiological Studies:  No results found.   IMPRESSIONS AND PLAN: A 61 y.o. female with   Node-negative ER/PR positive low-grade breast cancer with low Oncotype score. We spent time today discussing adjuvant hormonal therapy. I've recommended Arimidex. She has had a bone density test done which by her report was within normal limits. She is on vitamin D supplementation. I reviewed side effects  of Arimidex and gave her information off the website regarding this. I plan to see her in 3 months time.  Spent more than half the time coordinating care, as well as discussion of BMI and its implications.      Tanya Crothers 10/30/20137:59 PM Cell [phone]

## 2012-01-27 ENCOUNTER — Encounter: Payer: Self-pay | Admitting: Radiation Oncology

## 2012-01-30 ENCOUNTER — Telehealth: Payer: Self-pay | Admitting: *Deleted

## 2012-01-30 NOTE — Telephone Encounter (Signed)
Confirmed 02/23/12 appt w/ pt.

## 2012-01-31 ENCOUNTER — Encounter: Payer: Self-pay | Admitting: Radiation Oncology

## 2012-01-31 ENCOUNTER — Ambulatory Visit
Admission: RE | Admit: 2012-01-31 | Discharge: 2012-01-31 | Disposition: A | Discharge status: Home / Self Care | Payer: BC Managed Care – PPO | Source: Ambulatory Visit | Attending: Radiation Oncology | Admitting: Radiation Oncology

## 2012-01-31 VITALS — BP 127/48 | HR 76 | Temp 98.0°F | Resp 20 | Wt 140.8 lb

## 2012-01-31 DIAGNOSIS — C50419 Malignant neoplasm of upper-outer quadrant of unspecified female breast: Secondary | ICD-10-CM

## 2012-01-31 NOTE — Progress Notes (Signed)
Followup note:  Dominique Adams returns today approximately 1 month following completion of radiation therapy following conservative surgery in the management of her stage I (T1 B. N0 M0) invasive ductal/DCIS of the right breast. She is without complaints today. She is currently taking Arimidex and she sees Dr. Darnelle Catalan for a followup visit in mid-December. She'll see Dr. Jamey Ripa for a followup visit in January.  Physical examination: Alert and oriented. Wt Readings from Last 3 Encounters:  01/31/12 140 lb 12.8 oz (63.866 kg)  01/04/12 138 lb 9.6 oz (62.869 kg)  01/02/12 139 lb 8 oz (63.277 kg)   Temp Readings from Last 3 Encounters:  01/31/12 98 F (36.7 C) Oral  01/04/12 98.9 F (37.2 C) Oral  12/26/11 98.2 F (36.8 C) Oral   BP Readings from Last 3 Encounters:  01/31/12 127/48  01/04/12 100/68  01/02/12 115/68   Pulse Readings from Last 3 Encounters:  01/31/12 76  01/04/12 82  01/02/12 71    Head and neck examination: Grossly unremarkable. Nodes: Without palpable cervical, supraclavicular, or axillary lymphadenopathy. Chest: Lungs clear. Breasts: There is residual hyperpigmentation of the skin along the right breast. No masses are appreciated.  Impression: Satisfactory progress.  Plan: She'll maintain her followup with Dr. Darnelle Catalan and Dr. Jamey Ripa. Her mammography can be scheduled late spring through Dr. Jamey Ripa or Dr. Darnelle Catalan. I've not scheduled the patient for a formal followup visit, and I would be more than happy to see her again in the future should the need arise.

## 2012-01-31 NOTE — Progress Notes (Signed)
follow up rad txs right breast:12/06/11-01/02/12 4250 cGy/17 sessions,boost=750 cGy/3 sessions Alert,oriented x3, right breast well healed, slight tanning still, occasional twinges still in breast, on Arimidex 1 mg daily, mild hot flashes, no c/o pain 11:10 AM

## 2012-02-10 ENCOUNTER — Other Ambulatory Visit: Payer: Self-pay | Admitting: *Deleted

## 2012-02-10 DIAGNOSIS — C50419 Malignant neoplasm of upper-outer quadrant of unspecified female breast: Secondary | ICD-10-CM

## 2012-02-23 ENCOUNTER — Encounter: Payer: Self-pay | Admitting: Oncology

## 2012-02-23 ENCOUNTER — Other Ambulatory Visit (HOSPITAL_BASED_OUTPATIENT_CLINIC_OR_DEPARTMENT_OTHER): Payer: BC Managed Care – PPO | Admitting: Lab

## 2012-02-23 ENCOUNTER — Ambulatory Visit (HOSPITAL_BASED_OUTPATIENT_CLINIC_OR_DEPARTMENT_OTHER): Payer: BC Managed Care – PPO | Admitting: Oncology

## 2012-02-23 ENCOUNTER — Other Ambulatory Visit: Payer: Self-pay | Admitting: Oncology

## 2012-02-23 VITALS — BP 138/80 | HR 67 | Temp 98.6°F | Resp 20 | Ht 64.5 in | Wt 140.5 lb

## 2012-02-23 DIAGNOSIS — C50419 Malignant neoplasm of upper-outer quadrant of unspecified female breast: Secondary | ICD-10-CM

## 2012-02-23 DIAGNOSIS — Z170 Estrogen receptor positive status [ER+]: Secondary | ICD-10-CM

## 2012-02-23 DIAGNOSIS — C50919 Malignant neoplasm of unspecified site of unspecified female breast: Secondary | ICD-10-CM

## 2012-02-23 LAB — COMPREHENSIVE METABOLIC PANEL (CC13)
ALT: 11 U/L (ref 0–55)
Alkaline Phosphatase: 70 U/L (ref 40–150)
Creatinine: 0.9 mg/dL (ref 0.6–1.1)
Sodium: 140 mEq/L (ref 136–145)
Total Bilirubin: 0.31 mg/dL (ref 0.20–1.20)
Total Protein: 7.5 g/dL (ref 6.4–8.3)

## 2012-02-23 LAB — CBC WITH DIFFERENTIAL/PLATELET
BASO%: 0.7 % (ref 0.0–2.0)
HGB: 11.8 g/dL (ref 11.6–15.9)
LYMPH%: 27.4 % (ref 14.0–49.7)
MCH: 27.1 pg (ref 25.1–34.0)
MCHC: 34 g/dL (ref 31.5–36.0)
MCV: 79.6 fL (ref 79.5–101.0)
MONO%: 13.2 % (ref 0.0–14.0)
NEUT#: 3.1 10*3/uL (ref 1.5–6.5)
NEUT%: 57 % (ref 38.4–76.8)
Platelets: 290 10*3/uL (ref 145–400)
RBC: 4.35 10*6/uL (ref 3.70–5.45)
WBC: 5.5 10*3/uL (ref 3.9–10.3)

## 2012-02-23 NOTE — Progress Notes (Signed)
ID: Dominique Adams   DOB: 07/06/1950  MR#: [id number]  WUJ#:[id number]  PCP: Kari Baars, MD GYN: Konrad Dolores SU: Cicero Duck OTHER MD: Chipper Herb   HISTORY OF PRESENT ILLNESS: Dominique Adams had a screening mammogram on 09/16/2011 which showed have suspicious calcifications within the right breast leading [facility] 10[facility]04[facility]2011. This confirmed the suspicious calcifications within the upper-outer quadrant of the right breast and a stereotactic core biopsy the same day was diagnostic for invasive mammary carcinoma, favoring invasive ductal along with DCIS. Her carcinoma was strongly ER positive at 100% and PR positive at 95% with a low proliferation Ki-67 of 10%. HER-2/neu was not amplified. Breast MRI on 10/10/2011 showed a 1.9 cm area of enhancement consistent with post biopsy change.She is also felt to have a 7 mm intramammary node 5 mm posterior to the biopsy site, unchanged. On 10/28/2011 the patient underwent right lumpectomy and sentinel lymph node sampling, the final pathology ([id number]) showing an invasive ductal carcinoma, grade 1, measuring 0.9 cm, with all 4 sentinel lymph nodes clear. Repeat HER-2 testing again showed no amplification. Her subsequent history is as detailed below.  INTERVAL HISTORY: Dominique Adams returns today for followup of her breast cancer. She has been on tamoxifen approximately 6 weeks. So far she is tolerating it well, with no significant side effects and no worsening vaginal discharge as compared to baseline.   REVIEW OF SYSTEMS: She helped her brother a few days ago a cleanup the yard and her parents old house, doing a lot of yard work, and this has resulted in some stiffness in her right arm. She does not have the arthralgias/myalgias that some patients can get on Arimidex. A detailed review of systems today was entirely negative.  PAST MEDICAL HISTORY: Past Medical History  Diagnosis Date  . Thyroid disease   . Dysrhythmia      used to see dr tennant-he put her on meds-released her 2011  . Arthritis   . Wears glasses   . Breast cancer 10/04/11    right breast=invasive mammary ca,ductal ca in situ w/calcifications,ER/PR=Positive  . Blood transfusion     no surgery  . Anxiety   . History of radiation therapy 12/06/11-01/02/12    right breast,4250cGy/17sessions,boost=750cGy/3sesions    PAST SURGICAL HISTORY: Past Surgical History  Procedure Date  . Cystectomy 1995    lt ankle  . Right breast bx 10/04/11    righy uoq bx=invasive mammary ca,dcis w calcifications  . Right breast lumpectomy 10/28/11    right,invasive ductal ca,dcis,margins not involved,1 benign lympgh node axilla  . Colonoscopy 2009    neg    FAMILY HISTORY Family History  Problem Relation Age of Onset  . Arrhythmia Father 75  . Hypertension Father   . Arrhythmia Mother 54  . Hypertension Mother     chf  . Diabetes Mother   . Diabetes Father   . Cancer Mother     lung  . Cancer Father     prostate ca   the patient's father died at the age of 31 from heart disease. He had a history of prostate cancer. The patient's mother died at the age of 1, with lung cancer. She also had significant congestive heart failure. She was a smoker. The patient has 3 brothers and one sister. One brother died secondary to suicide with a history of depression. There is no history of breast or ovarian cancer in the immediate family  GYNECOLOGIC HISTORY: Menarche age 57, menopause 2003. First  live birth age 3. She is GX P0. She took hormone replacement only for approximately 6 months.  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 Son Sherrie Sport works for [employer] in [place] and son Benjamine Mola is a video oropharyngeal or was. The patient has 1 grandchild. She attends [employer]   ADVANCED DIRECTIVES: In place  HEALTH MAINTENANCE: History  Substance Use Topics  . Smoking status: Never Smoker    . Smokeless tobacco: Not on file  . Alcohol Use: No     Colonoscopy: Sheryn Bison  PAP: Konrad Dolores  Bone density:  Lipid panel:  No Known Allergies  Current Outpatient Prescriptions  Medication Sig Dispense Refill  . anastrozole (ARIMIDEX) 1 MG tablet Take 1 tablet (1 mg total) by mouth daily.  90 tablet  4  . Calcium Carb-Cholecalciferol (CALCIUM-VITAMIN D3) 600-500 MG-UNIT CAPS Take 1,500 mg by mouth daily.       . fish oil-omega-3 fatty acids 1000 MG capsule Take 2 g by mouth daily.      Marland Kitchen ibuprofen (ADVIL,MOTRIN) 200 MG tablet Take 200 mg by mouth every 6 (six) hours as needed.      Marland Kitchen levothyroxine (SYNTHROID, LEVOTHROID) 50 MCG tablet Take 50 mcg by mouth daily.      . metoprolol succinate (TOPROL-XL) 25 MG 24 hr tablet Take 25 mg by mouth daily. Takes 12.5mg  now stated patient 11/22/11      . PARoxetine (PAXIL) 20 MG tablet Take 20 mg by mouth every morning.      . hyaluronate sodium (RADIAPLEXRX) GEL Apply 1 application topically 2 (two) times daily. Apply to affected area after rad tx and bedtime, not 4 hours before rad tx      . non-metallic deodorant (ALRA) MISC Apply 1 application topically daily as needed. Do not apply 4 hours prior to rad tx        OBJECTIVE: Middle-aged African American woman who appears younger than stated age 61 Vitals:   02/23/12 1636  BP: 138/80  Pulse: 67  Temp: 98.6 F (37 C)  Resp: 20     Body mass index is 23.74 kg/(m^2).    ECOG FS: 0  Sclerae unicteric Oropharynx clear No cervical or supraclavicular adenopathy Lungs no rales or rhonchi Heart regular rate and rhythm Abd benign MSK no focal spinal tenderness, no peripheral edema and in particular no right upper extremity lymphedema Neuro: nonfocal Breasts: The right breast is status post lumpectomy and radiation. There is minimal hyperpigmentation. There are no findings suspicious for residual or recurrent disease. The right axilla is benign. The left breast is  unremarkable.   LAB RESULTS: Lab Results  Component Value Date   WBC 5.5 02/23/2012   NEUTROABS 3.1 02/23/2012   HGB 11.8 02/23/2012   HCT 34.6* 02/23/2012   MCV 79.6 02/23/2012   PLT 290 02/23/2012      Chemistry      Component Value Date/Time   NA 140 02/23/2012 1556   NA 138 10/26/2011 1500   K 3.9 02/23/2012 1556   K 4.9 10/26/2011 1500   CL 102 02/23/2012 1556   CL 101 10/26/2011 1500   CO2 30* 02/23/2012 1556   CO2 31 10/26/2011 1500   BUN 19.0 02/23/2012 1556   BUN 14 10/26/2011 1500   CREATININE 0.9 02/23/2012 1556   CREATININE 0.76 10/26/2011 1500      Component Value Date/Time   CALCIUM 9.4 02/23/2012 1556   CALCIUM 10.0 10/26/2011  1500   ALKPHOS 70 02/23/2012 1556   ALKPHOS 59 10/12/2011 0846   AST 22 02/23/2012 1556   AST 18 10/12/2011 0846   ALT 11 02/23/2012 1556   ALT 9 10/12/2011 0846   BILITOT 0.31 02/23/2012 1556   BILITOT 0.4 10/12/2011 0846       Lab Results  Component Value Date   LABCA2 15 01/04/2012    No components found with this basename: [id number]    No results found for this basename: INR:1;PROTIME:1 in the last 168 hours  Urinalysis    Component Value Date/Time   COLORURINE YELLOW 08/28/2009 1203   APPEARANCEUR CLEAR 08/28/2009 1203   LABSPEC >1.030* 08/28/2009 1203   PHURINE 5.5 08/28/2009 1203   GLUCOSEU NEGATIVE 08/28/2009 1203   HGBUR NEGATIVE 08/28/2009 1203   BILIRUBINUR NEGATIVE 08/28/2009 1203   KETONESUR NEGATIVE 08/28/2009 1203   PROTEINUR NEGATIVE 08/28/2009 1203   UROBILINOGEN 0.2 08/28/2009 1203   NITRITE NEGATIVE 08/28/2009 1203   LEUKOCYTESUR NEGATIVE MICROSCOPIC NOT DONE ON URINES WITH NEGATIVE PROTEIN, BLOOD, LEUKOCYTES, NITRITE, OR GLUCOSE <1000 mg/dL. 08/28/2009 1203    STUDIES: No results found. Repeat mammography will be due June 2014  ASSESSMENT: 61 y.o. [place] woman status post right lumpectomy 10/28/2011 for a pT1b pN0, stage IA invasive ductal carcinoma, grade 1, strongly estrogen and progesterone receptor  positive, with an MIB-1 of 10% and no HER-2 amplification  (1) Oncotype DX showed a score of 18 predicting a 10 year risk of distant recurrence of 12% if the patient's only systemic treatment consisted of 5 years of tamoxifen  (2) adjuvant radiation was completed 01/02/2012  (3) anastrozole started November 2013    PLAN: Ieisha had many questions today regarding the dose of Arimidex and whether she should have received chemotherapy. Her Oncotype score of 18 fell at the borderline between the low and intermediate risk groups. The low risk group essentially.0% benefit from chemotherapy. The intermediate risk group had very little benefit from chemotherapy. In her case, since our index is head-to-head more effective than tamoxifen by about 2-3%, her risk of distant recurrence at 10 years with a rim index I would be about 9%. In general chemotherapy lowers risk by about a third so her benefit from chemotherapy might be a 3% risk reduction. This means 97 1100 women like her would get no benefit from chemotherapy. When she heard the statistics she was very comfortable not having received chemotherapy.  She brought some AFLAC claims that were completed and we also discussed of the fact that she is welcome to use her right arm just is freely if she uses the left, although she should not allow people to draw blood or take blood pressures in her right arm is avoidable. She will be seeing Dr. Clelia Croft in January. She will see [place] again in April. She will see Dr. Jennette Kettle likely in July. In that case she will see me again in October and after that point I will start seeing her on a once a year basis. She knows to call for any problems that may develop before the next visit.   Jasilyn Holderman C    02/23/2012

## 2012-02-24 ENCOUNTER — Telehealth: Payer: Self-pay | Admitting: Oncology

## 2012-02-24 NOTE — Telephone Encounter (Signed)
Left voice message to inform the patient of the new date and time

## 2012-02-24 NOTE — Telephone Encounter (Signed)
Fax letter to Dr.Neal office from Dr.Magrinat

## 2012-02-27 ENCOUNTER — Encounter: Payer: Self-pay | Admitting: *Deleted

## 2012-02-27 NOTE — Progress Notes (Signed)
Mailed after appt letter to pt. 

## 2012-03-06 ENCOUNTER — Ambulatory Visit (INDEPENDENT_AMBULATORY_CARE_PROVIDER_SITE_OTHER): Payer: BC Managed Care – PPO | Admitting: Surgery

## 2012-03-06 ENCOUNTER — Encounter (INDEPENDENT_AMBULATORY_CARE_PROVIDER_SITE_OTHER): Payer: Self-pay | Admitting: Surgery

## 2012-03-06 VITALS — BP 114/62 | HR 72 | Temp 97.8°F | Resp 16 | Ht 64.75 in | Wt 141.0 lb

## 2012-03-06 DIAGNOSIS — C50419 Malignant neoplasm of upper-outer quadrant of unspecified female breast: Secondary | ICD-10-CM

## 2012-03-06 NOTE — Progress Notes (Addendum)
NAME: Dominique Adams       DOB: Dec 07, 1950           DATE: 03/06/2012       MRN: [id number]   Dominique Adams is a 61 y.o.Marland Kitchenfemale who presents for routine followup of her Right breast invasive ductal carcinoma, receptor positive, stage IA  diagnosed in July, 2013 and treated with right wire localized lumpectomy and SLN biopsy followed by radiation therapy. She has no problems or concerns on either side.  PFSH: She has had no significant changes since the last visit here.  ROS: There have been no significant changes since the last visit here  EXAM:  VS: BP 114/62  Pulse 72  Temp 97.8 F (36.6 C) (Temporal)  Resp 16  Ht 5' 4.75" (1.645 m)  Wt 141 lb (63.957 kg)  BMI 23.65 kg/m2  General: The patient is alert, oriented, generally healthy appearing, NAD. Mood and affect are normal.  Breasts:  the right breast is basically normal. There almost no radiation changes. The lumpectomy site is soft and well-healed. No other abnormalities are noted.the left breast is normal.  Lymphatics: She has no axillary or supraclavicular adenopathy on either side.  Extremities: Full ROM of the surgical side with no lymphedema noted.  Data Reviewed: I have reviewed the notes in the electronic medical record   Impression: Doing well, with no evidence of recurrent cancer or new cancer  Plan: I will see back in about 6 months, after her next mammogram.

## 2012-03-06 NOTE — Patient Instructions (Signed)
See me again after your next mammogram 

## 2012-04-10 ENCOUNTER — Ambulatory Visit: Payer: BC Managed Care – PPO | Admitting: Oncology

## 2012-04-10 ENCOUNTER — Other Ambulatory Visit: Payer: BC Managed Care – PPO | Admitting: Lab

## 2012-04-30 ENCOUNTER — Other Ambulatory Visit: Payer: Self-pay | Admitting: Physician Assistant

## 2012-05-02 ENCOUNTER — Telehealth: Payer: Self-pay | Admitting: Oncology

## 2012-05-02 NOTE — Telephone Encounter (Signed)
will mail appt/ cal

## 2012-05-02 NOTE — Telephone Encounter (Signed)
left a message w/ appt d/t.Marland Kitchenalso requested that pt call and confirm my message...td

## 2012-06-18 ENCOUNTER — Other Ambulatory Visit: Payer: BC Managed Care – PPO | Admitting: Lab

## 2012-06-25 ENCOUNTER — Ambulatory Visit: Payer: BC Managed Care – PPO | Admitting: Physician Assistant

## 2012-06-27 ENCOUNTER — Other Ambulatory Visit: Payer: Self-pay | Admitting: Physician Assistant

## 2012-06-27 ENCOUNTER — Telehealth: Payer: Self-pay | Admitting: Oncology

## 2012-06-27 ENCOUNTER — Encounter: Payer: Self-pay | Admitting: Physician Assistant

## 2012-06-27 ENCOUNTER — Ambulatory Visit (HOSPITAL_BASED_OUTPATIENT_CLINIC_OR_DEPARTMENT_OTHER): Payer: BC Managed Care – PPO | Admitting: Physician Assistant

## 2012-06-27 ENCOUNTER — Other Ambulatory Visit (HOSPITAL_BASED_OUTPATIENT_CLINIC_OR_DEPARTMENT_OTHER): Payer: BC Managed Care – PPO | Admitting: Lab

## 2012-06-27 VITALS — BP 103/69 | HR 80 | Temp 98.3°F | Resp 20 | Ht 64.75 in | Wt 137.0 lb

## 2012-06-27 DIAGNOSIS — C50419 Malignant neoplasm of upper-outer quadrant of unspecified female breast: Secondary | ICD-10-CM

## 2012-06-27 DIAGNOSIS — Z780 Asymptomatic menopausal state: Secondary | ICD-10-CM

## 2012-06-27 DIAGNOSIS — C50411 Malignant neoplasm of upper-outer quadrant of right female breast: Secondary | ICD-10-CM

## 2012-06-27 DIAGNOSIS — Z853 Personal history of malignant neoplasm of breast: Secondary | ICD-10-CM

## 2012-06-27 DIAGNOSIS — C50919 Malignant neoplasm of unspecified site of unspecified female breast: Secondary | ICD-10-CM

## 2012-06-27 DIAGNOSIS — I890 Lymphedema, not elsewhere classified: Secondary | ICD-10-CM

## 2012-06-27 LAB — COMPREHENSIVE METABOLIC PANEL (CC13)
Alkaline Phosphatase: 69 U/L (ref 40–150)
CO2: 28 mEq/L (ref 22–29)
Creatinine: 0.9 mg/dL (ref 0.6–1.1)
Glucose: 98 mg/dl (ref 70–99)
Sodium: 141 mEq/L (ref 136–145)
Total Bilirubin: 0.47 mg/dL (ref 0.20–1.20)

## 2012-06-27 LAB — CBC WITH DIFFERENTIAL/PLATELET
Eosinophils Absolute: 0.1 10*3/uL (ref 0.0–0.5)
HCT: 36.6 % (ref 34.8–46.6)
LYMPH%: 34.3 % (ref 14.0–49.7)
MCHC: 34.2 g/dL (ref 31.5–36.0)
MCV: 75.5 fL — ABNORMAL LOW (ref 79.5–101.0)
MONO#: 0.4 10*3/uL (ref 0.1–0.9)
MONO%: 10.7 % (ref 0.0–14.0)
NEUT#: 2.2 10*3/uL (ref 1.5–6.5)
NEUT%: 53 % (ref 38.4–76.8)
Platelets: 250 10*3/uL (ref 145–400)
RBC: 4.85 10*6/uL (ref 3.70–5.45)
WBC: 4.1 10*3/uL (ref 3.9–10.3)

## 2012-06-27 NOTE — Progress Notes (Signed)
ID: [name]   DOB: 1951-02-07  MR#: [id number]  JYN#:[id number]  PCP: Kari Baars, MD GYN: Konrad Dolores SU: Cicero Duck OTHER MD: Chipper Herb   HISTORY OF PRESENT ILLNESS: Tanayia had a screening mammogram on 09/16/2011 which showed have suspicious calcifications within the right breast leading [facility]. This confirmed the suspicious calcifications within the upper-outer quadrant of the right breast and a stereotactic core biopsy the same day was diagnostic for invasive mammary carcinoma, favoring invasive ductal along with DCIS. Her carcinoma was strongly ER positive at 100% and PR positive at 95% with a low proliferation Ki-67 of 10%. HER-2/neu was not amplified. Breast MRI on 10/10/2011 showed a 1.9 cm area of enhancement consistent with post biopsy change.She is also felt to have a 7 mm intramammary node 5 mm posterior to the biopsy site, unchanged. On 10/28/2011 the patient underwent right lumpectomy and sentinel lymph node sampling, the final pathology ([id number]) showing an invasive ductal carcinoma, grade 1, measuring 0.9 cm, with all 4 sentinel lymph nodes clear. Repeat HER-2 testing again showed no amplification. Her subsequent history is as detailed below.  INTERVAL HISTORY: Capucine returns today for followup of her right breast cancer. She continues on anastrozole which she is tolerating well. She has some aches and pains, especially in the right leg, at night. She denies, however, any increased arthralgias otherwise, and certainly none of the patient is often associated with the anastrozole. She does have some postsurgical pain in the right breast, and the breast days "tender to touch". She's noted no redness, swelling, or skin changes.  Diane is planning a trip in late May and will be flying.  She and would like to be seen at the lymphedema clinic for evaluation and fitting for a compression sleeve prior to her trip.  REVIEW OF  SYSTEMS: [name] has had no recent illnesses and denies any fevers or chills. She has occasional hot flashes, but nothing she considers problematic. Her energy level is good. She's eating and drinking well and has no nausea or change in bowel or bladder habits. She's had no recent cough, shortness of breath, chest pain, or palpitations. She denies any abnormal headaches or dizziness.  A detailed review of systems is otherwise stable and noncontributory.   PAST MEDICAL HISTORY: Past Medical History  Diagnosis Date  . Thyroid disease   . Dysrhythmia     used to see dr tennant-he put her on meds-released her 2011  . Arthritis   . Wears glasses   . Breast cancer 10/04/11    right breast=invasive mammary ca,ductal ca in situ w/calcifications,ER/PR=Positive  . Blood transfusion     no surgery  . Anxiety   . History of radiation therapy 12/06/11-01/02/12    right breast,4250cGy/17sessions,boost=750cGy/3sesions    PAST SURGICAL HISTORY: Past Surgical History  Procedure Laterality Date  . Cystectomy  1995    lt ankle  . Right breast bx  10/04/11    righy uoq bx=invasive mammary ca,dcis w calcifications  . Right breast lumpectomy  10/28/11    right,invasive ductal ca,dcis,margins not involved,1 benign lympgh node axilla  . Colonoscopy  2009    neg    FAMILY HISTORY Family History  Problem Relation Age of Onset  . Arrhythmia Father 36  . Hypertension Father   . Arrhythmia Mother 1  . Hypertension Mother     chf  . Diabetes Mother   . Diabetes Father   . Cancer Mother  lung  . Cancer Father     prostate ca   the patient's father died at the age of 38 from heart disease. He had a history of prostate cancer. The patient's mother died at the age of 7, with lung cancer. She also had significant congestive heart failure. She was a smoker. The patient has 3 brothers and one sister. One brother died secondary to suicide with a history of depression. There is no history of breast or ovarian  cancer in the immediate family  GYNECOLOGIC HISTORY: Menarche age 51, menopause 2003. First live birth age 59. She is GX P0. She took hormone replacement only for approximately 6 months.  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 Son Sherrie Sport works for [employer] in [place] and son Benjamine Mola is a video oropharyngeal or was. The patient has 1 grandchild. She attends [employer]   ADVANCED DIRECTIVES: In place  HEALTH MAINTENANCE: History  Substance Use Topics  . Smoking status: Never Smoker   . Smokeless tobacco: Never Used  . Alcohol Use: No     Colonoscopy: Sheryn Bison  PAP: Konrad Dolores  Bone density:  Due in July 2014  Lipid panel:  No Known Allergies  Current Outpatient Prescriptions  Medication Sig Dispense Refill  . anastrozole (ARIMIDEX) 1 MG tablet Take 1 tablet (1 mg total) by mouth daily.  90 tablet  4  . Calcium Carb-Cholecalciferol (CALCIUM-VITAMIN D3) 600-500 MG-UNIT CAPS Take 1,500 mg by mouth daily.       . fish oil-omega-3 fatty acids 1000 MG capsule Take 2 g by mouth daily.      Marland Kitchen levothyroxine (SYNTHROID, LEVOTHROID) 50 MCG tablet Take 50 mcg by mouth daily.      . metoprolol succinate (TOPROL-XL) 25 MG 24 hr tablet Take 12.5 mg by mouth daily. Takes 12.5mg  now stated patient 11/22/11      . PARoxetine (PAXIL) 20 MG tablet Take 20 mg by mouth every morning.       No current facility-administered medications for this visit.    OBJECTIVE: Middle-aged [place] American woman who appears younger than stated age, and is in no acute distress Filed Vitals:   06/27/12 1110  BP: 103/69  Pulse: 80  Temp: 98.3 F (36.8 C)  Resp: 20     Body mass index is 22.96 kg/(m^2).    ECOG FS: 0 Filed Weights   06/27/12 1110  Weight: 137 lb (62.143 kg)   Sclerae unicteric Oropharynx clear No cervical or supraclavicular adenopathy Lungs clear to auscultation bilaterally, no rales or rhonchi Heart  regular rate and rhythm Abdomen is soft, nontender palpation, positive bowel sounds MSK no focal spinal tenderness, no current peripheral edema and in particular no right upper extremity lymphedema Neuro: nonfocal, well oriented with positive affect Breasts: The right breast is status post lumpectomy and radiation. There is some mild tenderness to palpation, especially along the lateral edge of the breast.  There are no findings suspicious for residual or recurrent disease.  The left breast is unremarkable. Axillae are benign bilaterally, with no palpable adenopathy noted.   LAB RESULTS: Lab Results  Component Value Date   WBC 4.1 06/27/2012   NEUTROABS 2.2 06/27/2012   HGB 12.5 06/27/2012   HCT 36.6 06/27/2012   MCV 75.5* 06/27/2012   PLT 250 06/27/2012      Chemistry      Component Value Date/Time   NA 141 06/27/2012 1049   NA 138 10/26/2011  1500   K 4.1 06/27/2012 1049   K 4.9 10/26/2011 1500   CL 103 06/27/2012 1049   CL 101 10/26/2011 1500   CO2 28 06/27/2012 1049   CO2 31 10/26/2011 1500   BUN 20.2 06/27/2012 1049   BUN 14 10/26/2011 1500   CREATININE 0.9 06/27/2012 1049   CREATININE 0.76 10/26/2011 1500      Component Value Date/Time   CALCIUM 9.6 06/27/2012 1049   CALCIUM 10.0 10/26/2011 1500   ALKPHOS 69 06/27/2012 1049   ALKPHOS 59 10/12/2011 0846   AST 19 06/27/2012 1049   AST 18 10/12/2011 0846   ALT 10 06/27/2012 1049   ALT 9 10/12/2011 0846   BILITOT 0.47 06/27/2012 1049   BILITOT 0.4 10/12/2011 0846       Lab Results  Component Value Date   LABCA2 15 01/04/2012     STUDIES:  No results found.   ASSESSMENT: 62 y.o. [place] woman status post right lumpectomy 10/28/2011 for a pT1b pN0, stage IA invasive ductal carcinoma, grade 1, strongly estrogen and progesterone receptor positive, with an MIB-1 of 10% and no HER-2 amplification  (1) Oncotype DX showed a score of 18 predicting a 10 year risk of distant recurrence of 12% if the patient's only systemic treatment consisted  of 5 years of tamoxifen  (2) adjuvant radiation was completed 01/02/2012  (3) anastrozole started November 2013    PLAN:  Mellissa appears to be doing very well with regards to her breast cancer, with no clinical evidence of recurrence.  She will continue on the anastrozole as before, the plan being to continue for a total of 5 years.  She is due for her next mammogram along with a bone density in July and will see Dr. Jamey Ripa soon thereafter to discuss those results.  We will see her back in October for labs and physical exam.  If she is doing well at that point, we will likely begin to see her on an annual basis.  Nina will be traveling in late May, and needs to be seen at the lymphedema clinic prior to that time to get a compression sleeve for the right upper extremity. I've also given her in order for a right prosthesis and mastectomy bra to establish balance and symmetry following her right partial mastectomy.  Patient voices understanding and agreement with this plan, and call with any changes or problems prior to her next scheduled appointment.   Josalyn Dettmann    06/27/2012

## 2012-06-28 ENCOUNTER — Ambulatory Visit: Payer: BC Managed Care – PPO | Attending: Physician Assistant | Admitting: Physical Therapy

## 2012-06-28 DIAGNOSIS — Z853 Personal history of malignant neoplasm of breast: Secondary | ICD-10-CM | POA: Insufficient documentation

## 2012-06-28 DIAGNOSIS — I890 Lymphedema, not elsewhere classified: Secondary | ICD-10-CM | POA: Insufficient documentation

## 2012-06-28 DIAGNOSIS — IMO0001 Reserved for inherently not codable concepts without codable children: Secondary | ICD-10-CM | POA: Insufficient documentation

## 2012-06-29 ENCOUNTER — Ambulatory Visit: Payer: BC Managed Care – PPO

## 2012-07-03 ENCOUNTER — Ambulatory Visit: Payer: BC Managed Care – PPO

## 2012-07-05 ENCOUNTER — Ambulatory Visit: Payer: BC Managed Care – PPO | Attending: Physician Assistant

## 2012-07-05 DIAGNOSIS — IMO0001 Reserved for inherently not codable concepts without codable children: Secondary | ICD-10-CM | POA: Insufficient documentation

## 2012-07-05 DIAGNOSIS — I890 Lymphedema, not elsewhere classified: Secondary | ICD-10-CM | POA: Insufficient documentation

## 2012-07-05 DIAGNOSIS — Z853 Personal history of malignant neoplasm of breast: Secondary | ICD-10-CM | POA: Insufficient documentation

## 2012-07-11 ENCOUNTER — Ambulatory Visit: Payer: BC Managed Care – PPO

## 2012-07-17 ENCOUNTER — Ambulatory Visit: Payer: BC Managed Care – PPO | Admitting: Physical Therapy

## 2012-07-20 ENCOUNTER — Ambulatory Visit: Payer: BC Managed Care – PPO

## 2012-07-26 ENCOUNTER — Ambulatory Visit: Payer: BC Managed Care – PPO | Admitting: Physical Therapy

## 2012-09-17 ENCOUNTER — Ambulatory Visit
Admission: RE | Admit: 2012-09-17 | Discharge: 2012-09-17 | Disposition: A | Discharge status: Home / Self Care | Payer: BC Managed Care – PPO | Source: Ambulatory Visit | Attending: Physician Assistant | Admitting: Physician Assistant

## 2012-09-17 DIAGNOSIS — Z853 Personal history of malignant neoplasm of breast: Secondary | ICD-10-CM

## 2012-10-26 ENCOUNTER — Encounter (INDEPENDENT_AMBULATORY_CARE_PROVIDER_SITE_OTHER): Payer: Self-pay | Admitting: Surgery

## 2012-10-26 ENCOUNTER — Ambulatory Visit (INDEPENDENT_AMBULATORY_CARE_PROVIDER_SITE_OTHER): Payer: BC Managed Care – PPO | Admitting: Surgery

## 2012-10-26 VITALS — BP 134/70 | HR 71 | Temp 98.0°F | Resp 18 | Ht 64.75 in | Wt 139.0 lb

## 2012-10-26 DIAGNOSIS — Z853 Personal history of malignant neoplasm of breast: Secondary | ICD-10-CM

## 2012-10-26 NOTE — Progress Notes (Signed)
NAME: Dominique Adams       DOB: 1951/01/26           DATE: 10/26/2012       MRN: [id number]   Dominique Adams is a 62 y.o.Mar[place] Kitchenfemale who presents for routine followup of her Right breast invasive ductal carcinoma, receptor positive, stage IA  diagnosed in July, 2013 and treated with right wire localized lumpectomy and sentinel lymph node biopsy followed by radiation therapy. She has no problems or concerns on either side.  PFSH: She has had no significant changes since the last visit here.  ROS: There have been no significant changes since the last visit here  EXAM:  VS: BP 134/70  Pulse 71  Temp(Src) 98 F (36.7 C)  Resp 18  Ht 5' 4.75" (1.645 m)  Wt 139 lb (63.05 kg)  BMI 23.3 kg/m2  General: The patient is alert, oriented, generally healthy appearing, NAD. Mood and affect are normal.  Breasts:  the right breast is basically normal. There almost no radiation changes. The lumpectomy site is soft and well-healed. No other abnormalities are noted.the left breast is normal.  Lymphatics: She has no axillary or supraclavicular adenopathy on either side.  Extremities: Full ROM of the surgical side with no lymphedema noted.  Data Reviewed: Mammogram in July: *RADIOLOGY REPORT*  Clinical Data: History of right lumpectomy 2013  DIGITAL DIAGNOSTIC BILATERAL MAMMOGRAM WITH CAD  Comparison: Multiple priors including 10/28/2011  Findings:  ACR Breast Density Category c: The breasts are heterogeneously  dense, which may obscure small masses.  Post lumpectomy changes within the right breast. No concerning  masses, calcifications or areas of non surgical architectural  distortion.  Mammographic images were processed with CAD.  IMPRESSION:  Post lumpectomy changes right breast. No concerning masses,  calcifications or areas nonsurgical architectural distortion.  RECOMMENDATION:  Diagnostic mammography in 1 year  I have discussed the findings and recommendations with the patient.   Results were also provided in writing at the conclusion of the  visit. If applicable, a reminder letter will be sent to the  patient regarding her next appointment.  BI-RADS CATEGORY 2: Benign finding(s).  Original Report Authenticated By: Annia Belt, M.D    Impression: Doing well, with no evidence of recurrent cancer or new cancer  Plan: She will continue annual mammograms and follow ups

## 2012-10-26 NOTE — Patient Instructions (Signed)
Continue annual mammograms and follow ups 

## 2012-12-18 ENCOUNTER — Other Ambulatory Visit (HOSPITAL_BASED_OUTPATIENT_CLINIC_OR_DEPARTMENT_OTHER): Payer: BC Managed Care – PPO | Admitting: Lab

## 2012-12-18 ENCOUNTER — Encounter (INDEPENDENT_AMBULATORY_CARE_PROVIDER_SITE_OTHER): Payer: Self-pay

## 2012-12-18 DIAGNOSIS — C50419 Malignant neoplasm of upper-outer quadrant of unspecified female breast: Secondary | ICD-10-CM

## 2012-12-18 DIAGNOSIS — C50411 Malignant neoplasm of upper-outer quadrant of right female breast: Secondary | ICD-10-CM

## 2012-12-18 LAB — CBC WITH DIFFERENTIAL/PLATELET
BASO%: 0.7 % (ref 0.0–2.0)
Eosinophils Absolute: 0 10*3/uL (ref 0.0–0.5)
HCT: 37.8 % (ref 34.8–46.6)
HGB: 12.7 g/dL (ref 11.6–15.9)
LYMPH%: 38.1 % (ref 14.0–49.7)
MCHC: 33.6 g/dL (ref 31.5–36.0)
MONO#: 0.5 10*3/uL (ref 0.1–0.9)
NEUT#: 1.9 10*3/uL (ref 1.5–6.5)
NEUT%: 47.2 % (ref 38.4–76.8)
Platelets: 268 10*3/uL (ref 145–400)
WBC: 4 10*3/uL (ref 3.9–10.3)
lymph#: 1.5 10*3/uL (ref 0.9–3.3)

## 2012-12-18 LAB — COMPREHENSIVE METABOLIC PANEL (CC13)
ALT: 10 U/L (ref 0–55)
Anion Gap: 9 mEq/L (ref 3–11)
CO2: 28 mEq/L (ref 22–29)
Calcium: 9.6 mg/dL (ref 8.4–10.4)
Chloride: 103 mEq/L (ref 98–109)
Creatinine: 0.9 mg/dL (ref 0.6–1.1)
Glucose: 116 mg/dl (ref 70–140)
Total Bilirubin: 0.41 mg/dL (ref 0.20–1.20)
Total Protein: 7.7 g/dL (ref 6.4–8.3)

## 2012-12-25 ENCOUNTER — Telehealth: Payer: Self-pay | Admitting: Oncology

## 2012-12-25 ENCOUNTER — Ambulatory Visit (HOSPITAL_BASED_OUTPATIENT_CLINIC_OR_DEPARTMENT_OTHER): Payer: BC Managed Care – PPO | Admitting: Oncology

## 2012-12-25 VITALS — BP 110/60 | HR 98 | Temp 98.2°F | Resp 18 | Ht 64.75 in | Wt 140.4 lb

## 2012-12-25 DIAGNOSIS — C50419 Malignant neoplasm of upper-outer quadrant of unspecified female breast: Secondary | ICD-10-CM

## 2012-12-25 DIAGNOSIS — C50411 Malignant neoplasm of upper-outer quadrant of right female breast: Secondary | ICD-10-CM

## 2012-12-25 DIAGNOSIS — E559 Vitamin D deficiency, unspecified: Secondary | ICD-10-CM

## 2012-12-25 DIAGNOSIS — Z170 Estrogen receptor positive status [ER+]: Secondary | ICD-10-CM | POA: Insufficient documentation

## 2012-12-25 MED ORDER — ANASTROZOLE 1 MG PO TABS: 1.0000 mg | ORAL_TABLET | Freq: Every day | ORAL | Status: DC

## 2012-12-25 NOTE — Progress Notes (Signed)
ID: [name]   DOB: 1950/11/20  MR#: [id number]  WUJ#:[id number]  PCP: Kari Baars, MD GYN: Konrad Dolores SU: Cicero Duck OTHER MD: Chipper Herb   HISTORY OF PRESENT ILLNESS: Dominique Adams had a screening mammogram on 09/16/2011 which showed have suspicious calcifications within the right breast leading [facility]2011. This confirmed the suspicious calcifications within the upper-outer quadrant of the right breast and a stereotactic core biopsy the same day was diagnostic for invasive mammary carcinoma, favoring invasive ductal along with DCIS. Her carcinoma was strongly ER positive at 100% and PR positive at 95% with a low proliferation Ki-67 of 10%. HER-2/neu was not amplified. Breast MRI on 10/10/2011 showed a 1.9 cm area of enhancement consistent with post biopsy change.She is also felt to have a 7 mm intramammary node 5 mm posterior to the biopsy site, unchanged. On 10/28/2011 the patient underwent right lumpectomy and sentinel lymph node sampling, the final pathology ([id number]) showing an invasive ductal carcinoma, grade 1, measuring 0.9 cm, with all 4 sentinel lymph nodes clear. Repeat HER-2 testing again showed no amplification. Her subsequent history is as detailed below.  INTERVAL HISTORY: Dominique Adams returns today for followup of her right breast cancer. The interval history is unremarkable. She is doing zumba with a friend. She already went through the live strong program.  REVIEW OF SYSTEMS: Dominique Adams "feels wonderful". A detailed review of systems today was entirely negative.   PAST MEDICAL HISTORY: Past Medical History  Diagnosis Date  . Thyroid disease   . Dysrhythmia     used to see dr tennant-he put her on meds-released her 2011  . Arthritis   . Wears glasses   . Breast cancer 10/04/11    right breast=invasive mammary ca,ductal ca in situ w/calcifications,ER/PR=Positive  . Blood transfusion     no surgery  . Anxiety   . History of  radiation therapy 12/06/11-01/02/12    right breast,4250cGy/17sessions,boost=750cGy/3sesions    PAST SURGICAL HISTORY: Past Surgical History  Procedure Laterality Date  . Cystectomy  1995    lt ankle  . Right breast bx  10/04/11    righy uoq bx=invasive mammary ca,dcis w calcifications  . Right breast lumpectomy  10/28/11    right,invasive ductal ca,dcis,margins not involved,1 benign lympgh node axilla  . Colonoscopy  2009    neg    FAMILY HISTORY Family History  Problem Relation Age of Onset  . Arrhythmia Father 29  . Hypertension Father   . Arrhythmia Mother 68  . Hypertension Mother     chf  . Diabetes Mother   . Diabetes Father   . Cancer Mother     lung  . Cancer Father     prostate ca   the patient's father died at the age of 88 from heart disease. He had a history of prostate cancer. The patient's mother died at the age of 26, with lung cancer. She also had significant congestive heart failure. She was a smoker. The patient has 3 brothers and one sister. One brother died secondary to suicide with a history of depression. There is no history of breast or ovarian cancer in the immediate family  GYNECOLOGIC HISTORY: Menarche age 31, menopause 2003. First live birth age 17. She is GX P0. She took hormone replacement only for approximately 6 months.  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 Son Sherrie Sport works for [employer] in [place] and son Benjamine Mola does  music videos. The patient has 1 grandchild. She attends [employer]   ADVANCED DIRECTIVES: In place  HEALTH MAINTENANCE: History  Substance Use Topics  . Smoking status: Never Smoker   . Smokeless tobacco: Never Used  . Alcohol Use: No     Colonoscopy: Sheryn Bison  PAP: Konrad Dolores  Bone density:  Due in July 2014  Lipid panel:  No Known Allergies  Current Outpatient Prescriptions  Medication Sig Dispense Refill  . anastrozole (ARIMIDEX)  1 MG tablet Take 1 tablet (1 mg total) by mouth daily.  90 tablet  4  . Calcium Carb-Cholecalciferol (CALCIUM-VITAMIN D3) 600-500 MG-UNIT CAPS Take 1,500 mg by mouth daily.       . fish oil-omega-3 fatty acids 1000 MG capsule Take 2 g by mouth daily.      Marland Kitchen levothyroxine (SYNTHROID, LEVOTHROID) 50 MCG tablet Take 50 mcg by mouth daily.      . metoprolol succinate (TOPROL-XL) 25 MG 24 hr tablet Take 12.5 mg by mouth daily. Takes 12.5mg  now stated patient 11/22/11      . PARoxetine (PAXIL) 20 MG tablet Take 20 mg by mouth every morning.       No current facility-administered medications for this visit.    OBJECTIVE: Middle-aged African American who appears well Filed Vitals:   12/25/12 1021  BP: 110/60  Pulse: 98  Temp: 98.2 F (36.8 C)  Resp: 18     Body mass index is 23.53 kg/(m^2).    ECOG FS: 0 Filed Weights   12/25/12 1021  Weight: 140 lb 6.4 oz (63.685 kg)   Sclerae unicteric Oropharynx clear No cervical or supraclavicular adenopathy Lungs clear to auscultation bilaterally, no rales or rhonchi Heart regular rate and rhythm, no murmur appreciated Abdomen is soft, nontender, positive bowel sounds MSK no focal spinal tenderness, no upper extremity lymphedema  Neuro: nonfocal, well oriented, positive affect Breasts: The right breast is status post lumpectomy and radiation. There is no evidence of disease recurrence. The right axilla is benign. The left breast is unremarkable.   LAB RESULTS: Results for AIDYNN, POLENDO (MRN [id number]) as of 12/25/2012 10:21  Ref. Range 11/28/2011 11:40 01/04/2012 15:29 02/23/2012 15:56 06/27/2012 10:49 12/18/2012 10:19  MCV Latest Range: 78.0-100.0 fL 79.6 80.2 79.6 75.5 (L) 78.8 (L)    Lab Results  Component Value Date   WBC 4.0 12/18/2012   NEUTROABS 1.9 12/18/2012   HGB 12.7 12/18/2012   HCT 37.8 12/18/2012   MCV 78.8* 12/18/2012   PLT 268 12/18/2012      Chemistry      Component Value Date/Time   NA 140 12/18/2012 1020   NA 138  10/26/2011 1500   K 4.2 12/18/2012 1020   K 4.9 10/26/2011 1500   CL 103 06/27/2012 1049   CL 101 10/26/2011 1500   CO2 28 12/18/2012 1020   CO2 31 10/26/2011 1500   BUN 17.1 12/18/2012 1020   BUN 14 10/26/2011 1500   CREATININE 0.9 12/18/2012 1020   CREATININE 0.76 10/26/2011 1500      Component Value Date/Time   CALCIUM 9.6 12/18/2012 1020   CALCIUM 10.0 10/26/2011 1500   ALKPHOS 64 12/18/2012 1020   ALKPHOS 59 10/12/2011 0846   AST 20 12/18/2012 1020   AST 18 10/12/2011 0846   ALT 10 12/18/2012 1020   ALT 9 10/12/2011 0846   BILITOT 0.41 12/18/2012 1020   BILITOT 0.4 10/12/2011 0846       Lab Results  Component Value Date   LABCA2  15 01/04/2012     STUDIES:  No results found.  ASSESSMENT: 62 y.o. [place] woman status post right lumpectomy 10/28/2011 for a pT1b pN0, stage IA invasive ductal carcinoma, grade 1, strongly estrogen and progesterone receptor positive, with an MIB-1 of 10% and no HER-2 amplification  (1) Oncotype DX showed a score of 18 predicting a 10 year risk of distant recurrence of 12% if the patient's only systemic treatment consisted of 5 years of tamoxifen  (2) adjuvant radiation was completed 01/02/2012  (3) anastrozole started November 2013  (4) [facility]    PLAN:  Lajoyce is doing fine from every point of view. The plan is to continue anastrozole until November of 2018. She is going to start seeing [place] on a yearly basis from this point.  Incidentally she very likely carries the alpha thalassemia trait: All her MCV 7 been in the 75-80 range with no anemia. The only reason to note this is to avoid the impression that she might be iron deficient. If aren't therapy is considered, the appropriate studies would have to be sent beforehand.  She knows to call for any problems that may develop before her next visit here. Kynleigh Artz C    12/25/2012

## 2012-12-25 NOTE — Telephone Encounter (Signed)
, °

## 2013-07-04 ENCOUNTER — Other Ambulatory Visit: Payer: Self-pay | Admitting: *Deleted

## 2013-07-04 DIAGNOSIS — E559 Vitamin D deficiency, unspecified: Secondary | ICD-10-CM

## 2013-07-04 MED ORDER — ANASTROZOLE 1 MG PO TABS: 1.0000 mg | ORAL_TABLET | Freq: Every day | ORAL | Status: DC

## 2013-08-13 ENCOUNTER — Other Ambulatory Visit: Payer: Self-pay | Admitting: Obstetrics & Gynecology

## 2013-08-13 DIAGNOSIS — Z853 Personal history of malignant neoplasm of breast: Secondary | ICD-10-CM

## 2013-09-19 ENCOUNTER — Encounter (HOSPITAL_COMMUNITY): Payer: Self-pay

## 2013-09-20 ENCOUNTER — Ambulatory Visit
Admission: RE | Admit: 2013-09-20 | Discharge: 2013-09-20 | Disposition: A | Discharge status: Home / Self Care | Payer: BC Managed Care – PPO | Source: Ambulatory Visit | Attending: Obstetrics & Gynecology | Admitting: Obstetrics & Gynecology

## 2013-09-20 ENCOUNTER — Encounter (INDEPENDENT_AMBULATORY_CARE_PROVIDER_SITE_OTHER): Payer: Self-pay

## 2013-09-20 ENCOUNTER — Other Ambulatory Visit: Payer: Self-pay | Admitting: Obstetrics & Gynecology

## 2013-09-20 DIAGNOSIS — Z853 Personal history of malignant neoplasm of breast: Secondary | ICD-10-CM

## 2013-09-27 ENCOUNTER — Other Ambulatory Visit: Payer: Self-pay | Admitting: Obstetrics & Gynecology

## 2013-09-27 DIAGNOSIS — C50919 Malignant neoplasm of unspecified site of unspecified female breast: Secondary | ICD-10-CM

## 2013-10-09 ENCOUNTER — Ambulatory Visit
Admission: RE | Admit: 2013-10-09 | Discharge: 2013-10-09 | Disposition: A | Discharge status: Home / Self Care | Payer: BC Managed Care – PPO | Source: Ambulatory Visit | Attending: Obstetrics & Gynecology | Admitting: Obstetrics & Gynecology

## 2013-10-09 DIAGNOSIS — C50919 Malignant neoplasm of unspecified site of unspecified female breast: Secondary | ICD-10-CM

## 2013-10-09 MED ORDER — GADOBENATE DIMEGLUMINE 529 MG/ML IV SOLN
13.0000 mL | Freq: Once | INTRAVENOUS | Status: AC | PRN
Start: 2013-10-09 — End: 2013-10-09
  Administered 2013-10-09: 13 mL via INTRAVENOUS

## 2013-12-19 ENCOUNTER — Other Ambulatory Visit (HOSPITAL_BASED_OUTPATIENT_CLINIC_OR_DEPARTMENT_OTHER): Payer: BC Managed Care – PPO

## 2013-12-19 DIAGNOSIS — C50411 Malignant neoplasm of upper-outer quadrant of right female breast: Secondary | ICD-10-CM

## 2013-12-19 DIAGNOSIS — E559 Vitamin D deficiency, unspecified: Secondary | ICD-10-CM

## 2013-12-19 LAB — COMPREHENSIVE METABOLIC PANEL (CC13)
ALBUMIN: 3.7 g/dL (ref 3.5–5.0)
ALT: 11 U/L (ref 0–55)
ANION GAP: 9 meq/L (ref 3–11)
AST: 18 U/L (ref 5–34)
Alkaline Phosphatase: 67 U/L (ref 40–150)
BILIRUBIN TOTAL: 0.43 mg/dL (ref 0.20–1.20)
BUN: 16.2 mg/dL (ref 7.0–26.0)
CALCIUM: 9.6 mg/dL (ref 8.4–10.4)
CHLORIDE: 106 meq/L (ref 98–109)
CO2: 27 meq/L (ref 22–29)
Creatinine: 0.9 mg/dL (ref 0.6–1.1)
GLUCOSE: 121 mg/dL (ref 70–140)
POTASSIUM: 4.1 meq/L (ref 3.5–5.1)
SODIUM: 143 meq/L (ref 136–145)
TOTAL PROTEIN: 7.4 g/dL (ref 6.4–8.3)

## 2013-12-19 LAB — CBC WITH DIFFERENTIAL/PLATELET
BASO%: 0.7 % (ref 0.0–2.0)
Basophils Absolute: 0 10*3/uL (ref 0.0–0.1)
EOS ABS: 0 10*3/uL (ref 0.0–0.5)
EOS%: 1 % (ref 0.0–7.0)
HEMATOCRIT: 35.7 % (ref 34.8–46.6)
HGB: 12.4 g/dL (ref 11.6–15.9)
LYMPH#: 1.5 10*3/uL (ref 0.9–3.3)
LYMPH%: 37.2 % (ref 14.0–49.7)
MCH: 26.8 pg (ref 25.1–34.0)
MCHC: 34.7 g/dL (ref 31.5–36.0)
MCV: 77.1 fL — ABNORMAL LOW (ref 79.5–101.0)
MONO#: 0.3 10*3/uL (ref 0.1–0.9)
MONO%: 8 % (ref 0.0–14.0)
NEUT#: 2.2 10*3/uL (ref 1.5–6.5)
NEUT%: 53.1 % (ref 38.4–76.8)
PLATELETS: 257 10*3/uL (ref 145–400)
RBC: 4.63 10*6/uL (ref 3.70–5.45)
RDW: 15.4 % — ABNORMAL HIGH (ref 11.2–14.5)
WBC: 4.1 10*3/uL (ref 3.9–10.3)

## 2013-12-19 LAB — FERRITIN CHCC: Ferritin: 91 ng/ml (ref 9–269)

## 2013-12-20 LAB — VITAMIN D 25 HYDROXY (VIT D DEFICIENCY, FRACTURES): VIT D 25 HYDROXY: 43 ng/mL (ref 30–89)

## 2013-12-26 ENCOUNTER — Encounter: Payer: Self-pay | Admitting: Nurse Practitioner

## 2013-12-26 ENCOUNTER — Ambulatory Visit (HOSPITAL_BASED_OUTPATIENT_CLINIC_OR_DEPARTMENT_OTHER): Payer: BC Managed Care – PPO | Admitting: Nurse Practitioner

## 2013-12-26 VITALS — BP 128/47 | HR 76 | Temp 99.0°F | Resp 18 | Ht 64.75 in | Wt 139.3 lb

## 2013-12-26 DIAGNOSIS — E559 Vitamin D deficiency, unspecified: Secondary | ICD-10-CM

## 2013-12-26 DIAGNOSIS — Z170 Estrogen receptor positive status [ER+]: Secondary | ICD-10-CM

## 2013-12-26 DIAGNOSIS — C50411 Malignant neoplasm of upper-outer quadrant of right female breast: Secondary | ICD-10-CM

## 2013-12-26 DIAGNOSIS — R718 Other abnormality of red blood cells: Secondary | ICD-10-CM

## 2013-12-26 DIAGNOSIS — D649 Anemia, unspecified: Secondary | ICD-10-CM

## 2013-12-26 DIAGNOSIS — Z23 Encounter for immunization: Secondary | ICD-10-CM

## 2013-12-26 DIAGNOSIS — D563 Thalassemia minor: Secondary | ICD-10-CM

## 2013-12-26 MED ORDER — INFLUENZA VAC SPLIT QUAD 0.5 ML IM SUSY
0.5000 mL | PREFILLED_SYRINGE | Freq: Once | INTRAMUSCULAR | Status: AC
  Administered 2013-12-26: 0.5 mL via INTRAMUSCULAR
  Filled 2013-12-26: qty 0.5

## 2013-12-26 NOTE — Progress Notes (Signed)
ID: Dominique Adams   DOB: 07/27/1950  MR#: [id number]  HYQ#:[id number]  PCP: Marton Redwood, MD GYN: Dory Horn SU: Osborn Coho OTHER MD: Arloa Koh  CHIEF COMPLAINT: right breast cancer  CURRENT TREATMENT: anastrozole daily  BREAST CANCER HISTORY: Dominique Adams had a screening mammogram on 09/16/2011 which showed have suspicious calcifications within the right breast leading [facility]. This confirmed the suspicious calcifications within the upper-outer quadrant of the right breast and a stereotactic core biopsy the same day was diagnostic for invasive mammary carcinoma, favoring invasive ductal along with DCIS. Her carcinoma was strongly ER positive at 100% and PR positive at 95% with a low proliferation Ki-67 of 10%. HER-2/neu was not amplified. Breast MRI on 10/10/2011 showed a 1.9 cm area of enhancement consistent with post biopsy change.She is also felt to have a 7 mm intramammary node 5 mm posterior to the biopsy site, unchanged. On 10/28/2011 the patient underwent right lumpectomy and sentinel lymph node sampling, the final pathology ([id number]) showing an invasive ductal carcinoma, grade 1, measuring 0.9 cm, with all 4 sentinel lymph nodes clear. Repeat HER-2 testing again showed no amplification. Her subsequent history is as detailed below.  INTERVAL HISTORY: Dominique Adams returns today for follow up of her breast cancer. She has been on anastrozole since November 2013 and is tolerating it well with few side effect that she is aware of. She denies hot flashes and arthralgias/myalgia, but had vaginal dryness even before she began the drug. The interval history is unremarkable. She is not exercising regularly but knows she should.   REVIEW OF SYSTEMS: Dominique Adams denies fevers, chills, nausea or vomiting. She has some constipation at times, and I advised her to begin stool softener at the least as she is afraid to try laxatives. She has no shortness of breath,  chest pain, cough, palpitations, or fatigue. She denies unexplained weight loss, night sweats, headaches, dizziness, or visual changes. A detailed review of systems is otherwise noncontributory.    PAST MEDICAL HISTORY: Past Medical History  Diagnosis Date  . Thyroid disease   . Dysrhythmia     used to see dr tennant-he put her on meds-released her 2011  . Arthritis   . Wears glasses   . Breast cancer 10/04/11    right breast=invasive mammary ca,ductal ca in situ w/calcifications,ER/PR=Positive  . Blood transfusion     no surgery  . Anxiety   . History of radiation therapy 12/06/11-01/02/12    right breast,4250cGy/17sessions,boost=750cGy/3sesions    PAST SURGICAL HISTORY: Past Surgical History  Procedure Laterality Date  . Cystectomy  1995    lt ankle  . Right breast bx  10/04/11    righy uoq bx=invasive mammary ca,dcis w calcifications  . Right breast lumpectomy  10/28/11    right,invasive ductal ca,dcis,margins not involved,1 benign lympgh node axilla  . Colonoscopy  2009    neg    FAMILY HISTORY Family History  Problem Relation Age of Onset  . Arrhythmia Father 84  . Hypertension Father   . Arrhythmia Mother 59  . Hypertension Mother     chf  . Diabetes Mother   . Diabetes Father   . Cancer Mother     lung  . Cancer Father     prostate ca   the patient's father died at the age of 72 from heart disease. He had a history of prostate cancer. The patient's mother died at the age of 6, with lung cancer. She also had significant congestive  heart failure. She was a smoker. The patient has 3 brothers and one sister. One brother died secondary to suicide with a history of depression. There is no history of breast or ovarian cancer in the immediate family  GYNECOLOGIC HISTORY: Menarche age 72, menopause 2003. First live birth age 35. She is GX P0. She took hormone replacement only for approximately 6 months.  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 Her  husband Dominique Adams is in Art gallery manager. Son Dominique Adams works for [employer] in [place] and son Dominique Adams does music videos. The patient has 1 grandchild. She attends [employer]   ADVANCED DIRECTIVES: In place  HEALTH MAINTENANCE: History  Substance Use Topics  . Smoking status: Never Smoker   . Smokeless tobacco: Never Used  . Alcohol Use: No     Colonoscopy: Verl Blalock  PAP: Dory Horn  Bone density:  Due in July 2014  Lipid panel:  No Known Allergies  Current Outpatient Prescriptions  Medication Sig Dispense Refill  . anastrozole (ARIMIDEX) 1 MG tablet Take 1 tablet (1 mg total) by mouth daily.  30 tablet  1  . Calcium Carb-Cholecalciferol (CALCIUM-VITAMIN D3) 600-500 MG-UNIT CAPS Take 1,500 mg by mouth daily.       . fish oil-omega-3 fatty acids 1000 MG capsule Take 2 g by mouth daily.      Marland Kitchen levothyroxine (SYNTHROID, LEVOTHROID) 50 MCG tablet Take 50 mcg by mouth daily.      . metoprolol succinate (TOPROL-XL) 25 MG 24 hr tablet Take 12.5 mg by mouth daily. Takes 12.41m now stated patient 11/22/11      . PARoxetine (PAXIL) 20 MG tablet Take 20 mg by mouth every morning.       No current facility-administered medications for this visit.    OBJECTIVE: Middle-aged African American who appears well Filed Vitals:   12/26/13 1050  BP: 128/47  Pulse: 76  Temp: 99 F (37.2 C)  Resp: 18     Body mass index is 23.35 kg/(m^2).    ECOG FS: 0 Filed Weights   12/26/13 1050  Weight: 139 lb 4.8 oz (63.186 kg)   Skin: warm, dry  HEENT: sclerae anicteric, conjunctivae pink, oropharynx clear. No thrush or mucositis.  Lymph Nodes: No cervical or supraclavicular lymphadenopathy  Lungs: clear to auscultation bilaterally, no rales, wheezes, or rhonci  Heart: regular rate and rhythm  Abdomen: round, soft, non tender, positive bowel sounds  Musculoskeletal: No focal spinal tenderness, no peripheral edema  Neuro: non focal, well oriented, positive affect  Breasts: right breast  status post lumpectomy and radiation. No evidence of local recurrence. Right axilla benign. Left breast unremarkable.   LAB RESULTS: Results for JSHAHLA, BETSILL(MRN [id number] as of 12/25/2012 10:21  Ref. Range 11/28/2011 11:40 01/04/2012 15:29 02/23/2012 15:56 06/27/2012 10:49 12/18/2012 10:19  MCV Latest Range: 78.0-100.0 fL 79.6 80.2 79.6 75.5 (L) 78.8 (L)    Lab Results  Component Value Date   WBC 4.1 12/19/2013   NEUTROABS 2.2 12/19/2013   HGB 12.4 12/19/2013   HCT 35.7 12/19/2013   MCV 77.1* 12/19/2013   PLT 257 12/19/2013      Chemistry      Component Value Date/Time   NA 143 12/19/2013 1002   NA 138 10/26/2011 1500   K 4.1 12/19/2013 1002   K 4.9 10/26/2011 1500   CL 103 06/27/2012 1049   CL 101 10/26/2011 1500   CO2 27 12/19/2013 1002   CO2 31 10/26/2011 1500   BUN  16.2 12/19/2013 1002   BUN 14 10/26/2011 1500   CREATININE 0.9 12/19/2013 1002   CREATININE 0.76 10/26/2011 1500      Component Value Date/Time   CALCIUM 9.6 12/19/2013 1002   CALCIUM 10.0 10/26/2011 1500   ALKPHOS 67 12/19/2013 1002   ALKPHOS 59 10/12/2011 0846   AST 18 12/19/2013 1002   AST 18 10/12/2011 0846   ALT 11 12/19/2013 1002   ALT 9 10/12/2011 0846   BILITOT 0.43 12/19/2013 1002   BILITOT 0.4 10/12/2011 0846       Lab Results  Component Value Date   LABCA2 15 01/04/2012     STUDIES: Most recent mammogram was 09/20/13, followed up by breast MRI on 10/09/13 because of suspicious looking tissue. Likely benign.  ASSESSMENT: 63 y.o. [place] woman status post right lumpectomy 10/28/2011 for a pT1b pN0, stage IA invasive ductal carcinoma, grade 1, strongly estrogen and progesterone receptor positive, with an MIB-1 of 10% and no HER-2 amplification  (1) Oncotype DX showed a score of 18 predicting a 10 year risk of distant recurrence of 12% if the patient's only systemic treatment consisted of 5 years of tamoxifen  (2) adjuvant radiation was completed 01/02/2012  (3) anastrozole started November  2013  (4) [facility]  (5) likely carries trait for alpha thalassemia. Ferritin normal on 12/19/13. MCV chronically low.    PLAN:  Sherald is doing well today. The labs were reviewed in detail and were stable. Again her MCV was low, but her ferritin levels were normal, consistent with the hypothesis of her carrying the alpha thalassemia trait. I explained to [place] what this diagnosis mean, and all questions were answered. At this time she is not anemic. She is tolerating the anastrozole well and the plan is to continue until November of 2018 for a total of 5 years of antiestrogen therapy.  Linet will return for a repeat mammogram in January 2016 for a 6 month follow up of the suspicious breast tissue. She will return next year to this clinic in October 2016 for labs and an office visit. She understands and agrees with this plan. She knows the goal of treatment in her case is cure. She has been encouraged to call with any issues that might arise before her next visit here.    Marcelino Duster    12/26/2013

## 2013-12-27 MED ORDER — ANASTROZOLE 1 MG PO TABS
1.0000 mg | ORAL_TABLET | Freq: Every day | ORAL | Status: DC
Start: 2013-12-27 — End: 2014-12-29

## 2013-12-27 NOTE — Addendum Note (Signed)
Addended by: Marcelino Duster on: 12/27/2013 02:48 PM   Modules accepted: Orders

## 2014-01-06 ENCOUNTER — Encounter: Payer: Self-pay | Admitting: Nurse Practitioner

## 2014-01-29 ENCOUNTER — Emergency Department: Payer: Self-pay | Admitting: Emergency Medicine

## 2014-02-20 ENCOUNTER — Other Ambulatory Visit: Payer: Self-pay | Admitting: Obstetrics & Gynecology

## 2014-02-20 DIAGNOSIS — N63 Unspecified lump in unspecified breast: Secondary | ICD-10-CM

## 2014-03-05 ENCOUNTER — Ambulatory Visit: Payer: BC Managed Care – PPO | Attending: Orthopaedic Surgery

## 2014-03-05 DIAGNOSIS — R5381 Other malaise: Secondary | ICD-10-CM | POA: Insufficient documentation

## 2014-03-05 DIAGNOSIS — M545 Low back pain: Secondary | ICD-10-CM | POA: Diagnosis not present

## 2014-03-11 ENCOUNTER — Ambulatory Visit: Payer: BC Managed Care – PPO | Attending: Orthopaedic Surgery | Admitting: Physical Therapy

## 2014-03-11 DIAGNOSIS — M545 Low back pain: Secondary | ICD-10-CM | POA: Diagnosis not present

## 2014-03-11 DIAGNOSIS — R5381 Other malaise: Secondary | ICD-10-CM | POA: Insufficient documentation

## 2014-03-14 ENCOUNTER — Ambulatory Visit: Payer: BC Managed Care – PPO | Admitting: Physical Therapy

## 2014-03-14 DIAGNOSIS — M545 Low back pain: Secondary | ICD-10-CM | POA: Diagnosis not present

## 2014-03-18 ENCOUNTER — Ambulatory Visit: Payer: BC Managed Care – PPO

## 2014-03-18 DIAGNOSIS — M545 Low back pain: Secondary | ICD-10-CM | POA: Diagnosis not present

## 2014-03-24 ENCOUNTER — Ambulatory Visit: Payer: BC Managed Care – PPO

## 2014-03-24 DIAGNOSIS — M545 Low back pain: Secondary | ICD-10-CM | POA: Diagnosis not present

## 2014-03-25 ENCOUNTER — Ambulatory Visit
Admission: RE | Admit: 2014-03-25 | Discharge: 2014-03-25 | Disposition: A | Discharge status: Home / Self Care | Payer: BC Managed Care – PPO | Source: Ambulatory Visit | Attending: Obstetrics & Gynecology | Admitting: Obstetrics & Gynecology

## 2014-03-25 DIAGNOSIS — N63 Unspecified lump in unspecified breast: Secondary | ICD-10-CM

## 2014-03-27 ENCOUNTER — Ambulatory Visit: Payer: BC Managed Care – PPO | Admitting: Physical Therapy

## 2014-04-02 ENCOUNTER — Ambulatory Visit: Payer: BC Managed Care – PPO | Admitting: Physical Therapy

## 2014-04-02 DIAGNOSIS — M545 Low back pain: Secondary | ICD-10-CM | POA: Diagnosis not present

## 2014-04-07 ENCOUNTER — Ambulatory Visit: Payer: BC Managed Care – PPO | Attending: Orthopaedic Surgery | Admitting: Physical Therapy

## 2014-04-07 DIAGNOSIS — R5381 Other malaise: Secondary | ICD-10-CM | POA: Insufficient documentation

## 2014-04-07 DIAGNOSIS — M545 Low back pain: Secondary | ICD-10-CM | POA: Diagnosis not present

## 2014-09-24 ENCOUNTER — Other Ambulatory Visit: Payer: Self-pay | Admitting: Obstetrics & Gynecology

## 2014-09-24 DIAGNOSIS — N6310 Unspecified lump in the right breast, unspecified quadrant: Secondary | ICD-10-CM

## 2014-09-29 ENCOUNTER — Other Ambulatory Visit: Payer: BC Managed Care – PPO

## 2014-10-01 ENCOUNTER — Ambulatory Visit
Admission: RE | Admit: 2014-10-01 | Discharge: 2014-10-01 | Disposition: A | Discharge status: Home / Self Care | Payer: BC Managed Care – PPO | Source: Ambulatory Visit | Attending: Obstetrics & Gynecology | Admitting: Obstetrics & Gynecology

## 2014-10-01 DIAGNOSIS — N6310 Unspecified lump in the right breast, unspecified quadrant: Secondary | ICD-10-CM

## 2014-10-16 ENCOUNTER — Other Ambulatory Visit: Payer: Self-pay | Admitting: Obstetrics & Gynecology

## 2014-10-20 LAB — CYTOLOGY - PAP

## 2014-12-22 ENCOUNTER — Other Ambulatory Visit (HOSPITAL_BASED_OUTPATIENT_CLINIC_OR_DEPARTMENT_OTHER): Payer: Medicare Other

## 2014-12-22 ENCOUNTER — Other Ambulatory Visit: Payer: Self-pay

## 2014-12-22 DIAGNOSIS — C50411 Malignant neoplasm of upper-outer quadrant of right female breast: Secondary | ICD-10-CM

## 2014-12-22 LAB — CBC WITH DIFFERENTIAL/PLATELET
BASO%: 0.6 % (ref 0.0–2.0)
BASOS ABS: 0 10*3/uL (ref 0.0–0.1)
EOS%: 2.9 % (ref 0.0–7.0)
Eosinophils Absolute: 0.2 10*3/uL (ref 0.0–0.5)
HEMATOCRIT: 38.8 % (ref 34.8–46.6)
HEMOGLOBIN: 12.9 g/dL (ref 11.6–15.9)
LYMPH#: 1.8 10*3/uL (ref 0.9–3.3)
LYMPH%: 34.3 % (ref 14.0–49.7)
MCH: 26.2 pg (ref 25.1–34.0)
MCHC: 33.2 g/dL (ref 31.5–36.0)
MCV: 79 fL — ABNORMAL LOW (ref 79.5–101.0)
MONO#: 0.5 10*3/uL (ref 0.1–0.9)
MONO%: 9.7 % (ref 0.0–14.0)
NEUT%: 52.5 % (ref 38.4–76.8)
NEUTROS ABS: 2.7 10*3/uL (ref 1.5–6.5)
Platelets: 278 10*3/uL (ref 145–400)
RBC: 4.92 10*6/uL (ref 3.70–5.45)
RDW: 15.5 % — ABNORMAL HIGH (ref 11.2–14.5)
WBC: 5.2 10*3/uL (ref 3.9–10.3)

## 2014-12-22 LAB — COMPREHENSIVE METABOLIC PANEL (CC13)
ALT: 14 U/L (ref 0–55)
ANION GAP: 8 meq/L (ref 3–11)
AST: 20 U/L (ref 5–34)
Albumin: 3.8 g/dL (ref 3.5–5.0)
Alkaline Phosphatase: 70 U/L (ref 40–150)
BILIRUBIN TOTAL: 0.42 mg/dL (ref 0.20–1.20)
BUN: 20 mg/dL (ref 7.0–26.0)
CALCIUM: 9.3 mg/dL (ref 8.4–10.4)
CHLORIDE: 103 meq/L (ref 98–109)
CO2: 29 mEq/L (ref 22–29)
Creatinine: 0.8 mg/dL (ref 0.6–1.1)
EGFR: 84 mL/min/{1.73_m2} — AB (ref >90)
Glucose: 93 mg/dl (ref 70–140)
Potassium: 3.7 mEq/L (ref 3.5–5.1)
Sodium: 140 mEq/L (ref 136–145)
Total Protein: 7.5 g/dL (ref 6.4–8.3)

## 2014-12-29 ENCOUNTER — Telehealth: Payer: Self-pay | Admitting: Oncology

## 2014-12-29 ENCOUNTER — Ambulatory Visit (HOSPITAL_BASED_OUTPATIENT_CLINIC_OR_DEPARTMENT_OTHER): Payer: Medicare Other | Admitting: Oncology

## 2014-12-29 VITALS — BP 133/57 | HR 79 | Temp 98.2°F | Resp 18 | Ht 64.75 in | Wt 139.9 lb

## 2014-12-29 DIAGNOSIS — E559 Vitamin D deficiency, unspecified: Secondary | ICD-10-CM

## 2014-12-29 DIAGNOSIS — Z170 Estrogen receptor positive status [ER+]: Secondary | ICD-10-CM

## 2014-12-29 DIAGNOSIS — C50411 Malignant neoplasm of upper-outer quadrant of right female breast: Secondary | ICD-10-CM | POA: Diagnosis not present

## 2014-12-29 MED ORDER — ANASTROZOLE 1 MG PO TABS: 1.0000 mg | ORAL_TABLET | Freq: Every day | ORAL | Status: DC

## 2014-12-29 NOTE — Progress Notes (Signed)
ID: [name]   DOB: 31-Dec-1950  MR#: [id number]  IRW#:[id number]  PCP: Marton Redwood, MD GYN: Dory Horn SU: Osborn Coho OTHER MD: Arloa Koh  CHIEF COMPLAINT: right breast cancer  CURRENT TREATMENT: anastrozole daily  BREAST CANCER HISTORY: Marleigh had a screening mammogram on 09/16/2011 which showed have suspicious calcifications within the right breast leading [facility]. This confirmed the suspicious calcifications within the upper-outer quadrant of the right breast and a stereotactic core biopsy the same day was diagnostic for invasive mammary carcinoma, favoring invasive ductal along with DCIS. Her carcinoma was strongly ER positive at 100% and PR positive at 95% with a low proliferation Ki-67 of 10%. HER-2/neu was not amplified. Breast MRI on 10/10/2011 showed a 1.9 cm area of enhancement consistent with post biopsy change.She is also felt to have a 7 mm intramammary node 5 mm posterior to the biopsy site, unchanged. On 10/28/2011 the patient underwent right lumpectomy and sentinel lymph node sampling, the final pathology ([id number]) showing an invasive ductal carcinoma, grade 1, measuring 0.9 cm, with all 4 sentinel lymph nodes clear. Repeat HER-2 testing again showed no amplification. Her subsequent history is as detailed below.  INTERVAL HISTORY: Mckenzi returns today for follow up of her breast cancer. The interval history is generally unremarkable. She continues on anastrozole, with good tolerance. In particular hot flashes and vaginal dryness are not major issues. She obtains the drug at $17 for 3 month supply.   REVIEW OF SYSTEMS: Ruchi is not exercising regularly but she is normally active. I asked what are worse problem as its her son who just stopped making a hip pop video's and is starting to work as a Administrator. Her husband retired last year and she and he are just beginning to travel. Overall a detailed review of systems  today was noncontributory  PAST MEDICAL HISTORY: Past Medical History  Diagnosis Date  . Thyroid disease   . Dysrhythmia     used to see dr tennant-he put her on meds-released her 2011  . Arthritis   . Wears glasses   . Breast cancer 10/04/11    right breast=invasive mammary ca,ductal ca in situ w/calcifications,ER/PR=Positive  . Blood transfusion     no surgery  . Anxiety   . History of radiation therapy 12/06/11-01/02/12    right breast,4250cGy/17sessions,boost=750cGy/3sesions    PAST SURGICAL HISTORY: Past Surgical History  Procedure Laterality Date  . Cystectomy  1995    lt ankle  . Right breast bx  10/04/11    righy uoq bx=invasive mammary ca,dcis w calcifications  . Right breast lumpectomy  10/28/11    right,invasive ductal ca,dcis,margins not involved,1 benign lympgh node axilla  . Colonoscopy  2009    neg    FAMILY HISTORY Family History  Problem Relation Age of Onset  . Arrhythmia Father 65  . Hypertension Father   . Arrhythmia Mother 23  . Hypertension Mother     chf  . Diabetes Mother   . Diabetes Father   . Cancer Mother     lung  . Cancer Father     prostate ca   the patient's father died at the age of 40 from heart disease. He had a history of prostate cancer. The patient's mother died at the age of 70, with lung cancer. She also had significant congestive heart failure. She was a smoker. The patient has 3 brothers and one sister. One brother died secondary to suicide with a history  of depression. There is no history of breast or ovarian cancer in the immediate family  GYNECOLOGIC HISTORY: Menarche age 39, menopause 2003. First live birth age 35. She is GX P0. She took hormone replacement only for approximately 6 months.  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 Her husband Jiles Prows is in Art gallery manager. Son Derald Macleod works for [employer] in [place] and son Christen Bame does music videos. The patient has 1 grandchild. She attends [employer]   ADVANCED DIRECTIVES: In place  HEALTH MAINTENANCE: Social History  Substance Use Topics  . Smoking status: Never Smoker   . Smokeless tobacco: Never Used  . Alcohol Use: No     Colonoscopy: Verl Blalock  PAP: Dory Horn  Bone density:  Due in July 2014  Lipid panel:  No Known Allergies  Current Outpatient Prescriptions  Medication Sig Dispense Refill  . anastrozole (ARIMIDEX) 1 MG tablet Take 1 tablet (1 mg total) by mouth daily. 90 tablet 3  . Calcium Carb-Cholecalciferol (CALCIUM-VITAMIN D3) 600-500 MG-UNIT CAPS Take 1,500 mg by mouth daily.     . fish oil-omega-3 fatty acids 1000 MG capsule Take 2 g by mouth daily.    Marland Kitchen levothyroxine (SYNTHROID, LEVOTHROID) 50 MCG tablet Take 50 mcg by mouth daily.    . metoprolol succinate (TOPROL-XL) 25 MG 24 hr tablet Take 12.5 mg by mouth daily. Takes 12.31m now stated patient 11/22/11    . PARoxetine (PAXIL) 20 MG tablet Take 20 mg by mouth every morning.     No current facility-administered medications for this visit.    OBJECTIVE: Middle-aged African American who appears well  Filed Vitals:   12/29/14 1120  BP: 133/57  Pulse: 79  Temp: 98.2 F (36.8 C)  Resp: 18     Body mass index is 23.45 kg/(m^2).    ECOG FS: 0 Filed Weights   12/29/14 1120  Weight: 139 lb 14.4 oz (63.458 kg)   Sclerae unicteric, pupils round and equal Oropharynx clear and moist-- no thrush or other lesions No cervical or supraclavicular adenopathy Lungs no rales or rhonchi Heart regular rate and rhythm Abd soft, nontender, positive bowel sounds MSK no focal spinal tenderness, no upper extremity lymphedema Neuro: nonfocal, well oriented, appropriate affect Breasts: The right breast is status post lumpectomy and radiation. There is no evidence of local recurrence. The right axilla is benign. The left breast is unremarkable.    LAB RESULTS: Results for JDORRIS, PIERRE(MRN [id number] as of 12/25/2012 10:21  Ref. Range 11/28/2011  11:40 01/04/2012 15:29 02/23/2012 15:56 06/27/2012 10:49 12/18/2012 10:19  MCV Latest Range: 78.0-100.0 fL 79.6 80.2 79.6 75.5 (L) 78.8 (L)    Lab Results  Component Value Date   WBC 5.2 12/22/2014   NEUTROABS 2.7 12/22/2014   HGB 12.9 12/22/2014   HCT 38.8 12/22/2014   MCV 79.0* 12/22/2014   PLT 278 12/22/2014      Chemistry      Component Value Date/Time   NA 140 12/22/2014 1006   NA 138 10/26/2011 1500   K 3.7 12/22/2014 1006   K 4.9 10/26/2011 1500   CL 103 06/27/2012 1049   CL 101 10/26/2011 1500   CO2 29 12/22/2014 1006   CO2 31 10/26/2011 1500   BUN 20.0 12/22/2014 1006   BUN 14 10/26/2011 1500   CREATININE 0.8 12/22/2014 1006   CREATININE 0.76 10/26/2011 1500      Component Value Date/Time   CALCIUM 9.3 12/22/2014 1006   CALCIUM 10.0 10/26/2011  1500   ALKPHOS 70 12/22/2014 1006   ALKPHOS 59 10/12/2011 0846   AST 20 12/22/2014 1006   AST 18 10/12/2011 0846   ALT 14 12/22/2014 1006   ALT 9 10/12/2011 0846   BILITOT 0.42 12/22/2014 1006   BILITOT 0.4 10/12/2011 0846       Lab Results  Component Value Date   LABCA2 15 01/04/2012     STUDIES: CLINICAL DATA: History of malignant lumpectomy of the right breast in 2013 with radiation therapy. Followup evaluation.  EXAM: DIGITAL DIAGNOSTIC BILATERAL MAMMOGRAM WITH 3D TOMOSYNTHESIS AND CAD  COMPARISON: Previous examinations dated 03/25/2014, 09/20/2013, 09/17/2012, 09/16/2011.  ACR Breast Density Category b: There are scattered areas of fibroglandular density.  FINDINGS: There are scarring changes located within the upper outer quadrant of the right breast related to the patient's lumpectomy. These are stable. The parenchymal pattern is stable. There is no specific evidence for recurrent tumor or developing malignancy within either breast.  Mammographic images were processed with CAD.  IMPRESSION: Stable breast parenchymal pattern and stable scarring changes located within the superior  right breast related to the patient's lumpectomy. No findings worrisome for recurrent tumor or developing malignancy.  RECOMMENDATION: Bilateral diagnostic mammography in 1 year.  I have discussed the findings and recommendations with the patient. Results were also provided in writing at the conclusion of the visit. If applicable, a reminder letter will be sent to the patient regarding the next appointment.  BI-RADS CATEGORY 1: Negative.   Electronically Signed  By: Altamese Cabal M.D.  On: 10/01/2014 16:21   ASSESSMENT: 64 y.o. [place] woman status post right lumpectomy 10/28/2011 for a pT1b pN0, stage IA invasive ductal carcinoma, grade 1, strongly estrogen and progesterone receptor positive, with an MIB-1 of 10% and no HER-2 amplification  (1) Oncotype DX showed a score of 18 predicting a 10 year risk of distant recurrence of 12% if the patient's only systemic treatment consisted of 5 years of tamoxifen  (2) adjuvant radiation was completed 01/02/2012  (3) anastrozole started November 2013  (4) [facility]  (5) likely carries trait for alpha thalassemia. Ferritin normal on 12/19/13. MCV chronically low.    PLAN:  Neeley is 3 years into her 5 years of anastrozole. She is tolerating it well. She is obtaining it at a good price. The plan is tocontinue anastrozole for 2 more years after which she will "graduate" from follow-up  I have strongly encouraged her to exercise. She would like to walk, but doesn't like to walk in her neighborhood because of the dogs. She already did [employer], and her current insurance does not cover Silver sneakers, unfortunately.  An option for her would be to walk [place] malls, or to walk with someone who could make her feel a little bit more secure if as far as fogs are concerned.  Otherwise she knows to call for any problems that may develop before her next visit here. MAGRINAT,GUSTAV  C    12/29/2014

## 2014-12-29 NOTE — Telephone Encounter (Signed)
Appointments made and avs printed for pateint °

## 2015-04-10 DIAGNOSIS — E559 Vitamin D deficiency, unspecified: Secondary | ICD-10-CM | POA: Diagnosis not present

## 2015-04-10 DIAGNOSIS — R7301 Impaired fasting glucose: Secondary | ICD-10-CM | POA: Diagnosis not present

## 2015-04-10 DIAGNOSIS — E784 Other hyperlipidemia: Secondary | ICD-10-CM | POA: Diagnosis not present

## 2015-04-10 DIAGNOSIS — E038 Other specified hypothyroidism: Secondary | ICD-10-CM | POA: Diagnosis not present

## 2015-04-10 DIAGNOSIS — M859 Disorder of bone density and structure, unspecified: Secondary | ICD-10-CM | POA: Diagnosis not present

## 2015-04-17 DIAGNOSIS — E784 Other hyperlipidemia: Secondary | ICD-10-CM | POA: Diagnosis not present

## 2015-04-17 DIAGNOSIS — Z0000 Encounter for general adult medical examination without abnormal findings: Secondary | ICD-10-CM | POA: Diagnosis not present

## 2015-04-17 DIAGNOSIS — E038 Other specified hypothyroidism: Secondary | ICD-10-CM | POA: Diagnosis not present

## 2015-04-17 DIAGNOSIS — Z6823 Body mass index (BMI) 23.0-23.9, adult: Secondary | ICD-10-CM | POA: Diagnosis not present

## 2015-04-17 DIAGNOSIS — Z853 Personal history of malignant neoplasm of breast: Secondary | ICD-10-CM | POA: Diagnosis not present

## 2015-04-17 DIAGNOSIS — R002 Palpitations: Secondary | ICD-10-CM | POA: Diagnosis not present

## 2015-04-17 DIAGNOSIS — F418 Other specified anxiety disorders: Secondary | ICD-10-CM | POA: Diagnosis not present

## 2015-04-17 DIAGNOSIS — Z23 Encounter for immunization: Secondary | ICD-10-CM | POA: Diagnosis not present

## 2015-04-17 DIAGNOSIS — M859 Disorder of bone density and structure, unspecified: Secondary | ICD-10-CM | POA: Diagnosis not present

## 2015-04-17 DIAGNOSIS — R7301 Impaired fasting glucose: Secondary | ICD-10-CM | POA: Diagnosis not present

## 2015-04-17 DIAGNOSIS — Z1389 Encounter for screening for other disorder: Secondary | ICD-10-CM | POA: Diagnosis not present

## 2015-04-17 DIAGNOSIS — E559 Vitamin D deficiency, unspecified: Secondary | ICD-10-CM | POA: Diagnosis not present

## 2015-05-26 DIAGNOSIS — Z1212 Encounter for screening for malignant neoplasm of rectum: Secondary | ICD-10-CM | POA: Diagnosis not present

## 2015-08-13 ENCOUNTER — Emergency Department: Payer: No Typology Code available for payment source

## 2015-08-13 ENCOUNTER — Emergency Department
Admission: EM | Admit: 2015-08-13 | Discharge: 2015-08-13 | Disposition: A | Discharge status: Home / Self Care | Payer: No Typology Code available for payment source | Attending: Emergency Medicine | Admitting: Emergency Medicine

## 2015-08-13 DIAGNOSIS — Y92410 Unspecified street and highway as the place of occurrence of the external cause: Secondary | ICD-10-CM | POA: Diagnosis not present

## 2015-08-13 DIAGNOSIS — S5012XA Contusion of left forearm, initial encounter: Secondary | ICD-10-CM | POA: Insufficient documentation

## 2015-08-13 DIAGNOSIS — M62838 Other muscle spasm: Secondary | ICD-10-CM | POA: Diagnosis not present

## 2015-08-13 DIAGNOSIS — M62830 Muscle spasm of back: Secondary | ICD-10-CM | POA: Diagnosis not present

## 2015-08-13 DIAGNOSIS — M1990 Unspecified osteoarthritis, unspecified site: Secondary | ICD-10-CM | POA: Diagnosis not present

## 2015-08-13 DIAGNOSIS — Y939 Activity, unspecified: Secondary | ICD-10-CM | POA: Diagnosis not present

## 2015-08-13 DIAGNOSIS — S62304A Unspecified fracture of fourth metacarpal bone, right hand, initial encounter for closed fracture: Secondary | ICD-10-CM

## 2015-08-13 DIAGNOSIS — G44319 Acute post-traumatic headache, not intractable: Secondary | ICD-10-CM | POA: Insufficient documentation

## 2015-08-13 DIAGNOSIS — Z79899 Other long term (current) drug therapy: Secondary | ICD-10-CM | POA: Diagnosis not present

## 2015-08-13 DIAGNOSIS — Y999 Unspecified external cause status: Secondary | ICD-10-CM | POA: Insufficient documentation

## 2015-08-13 DIAGNOSIS — M25531 Pain in right wrist: Secondary | ICD-10-CM | POA: Diagnosis not present

## 2015-08-13 DIAGNOSIS — Z853 Personal history of malignant neoplasm of breast: Secondary | ICD-10-CM | POA: Diagnosis not present

## 2015-08-13 DIAGNOSIS — S62324A Displaced fracture of shaft of fourth metacarpal bone, right hand, initial encounter for closed fracture: Secondary | ICD-10-CM | POA: Diagnosis not present

## 2015-08-13 DIAGNOSIS — S6991XA Unspecified injury of right wrist, hand and finger(s), initial encounter: Secondary | ICD-10-CM | POA: Diagnosis present

## 2015-08-13 MED ORDER — HYDROCODONE-ACETAMINOPHEN 5-325 MG PO TABS: 1.0000 | ORAL_TABLET | ORAL | Status: DC | PRN

## 2015-08-13 MED ORDER — NAPROXEN 500 MG PO TABS: 500.0000 mg | ORAL_TABLET | Freq: Two times a day (BID) | ORAL | Status: DC

## 2015-08-13 MED ORDER — METHOCARBAMOL 500 MG PO TABS: 500.0000 mg | ORAL_TABLET | Freq: Four times a day (QID) | ORAL | Status: DC
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
## 2015-08-18 DIAGNOSIS — S62324A Displaced fracture of shaft of fourth metacarpal bone, right hand, initial encounter for closed fracture: Secondary | ICD-10-CM | POA: Diagnosis not present

## 2015-08-19 ENCOUNTER — Encounter (HOSPITAL_COMMUNITY): Payer: Self-pay | Admitting: Emergency Medicine

## 2015-08-19 ENCOUNTER — Ambulatory Visit (HOSPITAL_COMMUNITY)
Admission: EM | Admit: 2015-08-19 | Discharge: 2015-08-19 | Disposition: A | Discharge status: Home / Self Care | Payer: Medicare Other | Attending: Emergency Medicine | Admitting: Emergency Medicine

## 2015-08-19 DIAGNOSIS — M62838 Other muscle spasm: Secondary | ICD-10-CM | POA: Diagnosis not present

## 2015-08-19 DIAGNOSIS — S2000XA Contusion of breast, unspecified breast, initial encounter: Secondary | ICD-10-CM

## 2015-08-19 DIAGNOSIS — S40022A Contusion of left upper arm, initial encounter: Secondary | ICD-10-CM

## 2015-08-19 DIAGNOSIS — N6489 Other specified disorders of breast: Secondary | ICD-10-CM

## 2015-08-19 DIAGNOSIS — S301XXA Contusion of abdominal wall, initial encounter: Secondary | ICD-10-CM | POA: Diagnosis not present
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
## 2015-08-19 NOTE — Discharge Instructions (Signed)
Everything seems to be healing as it should. The bruising will likely take another week to resolve. The knots, or hematomas, within the bruising will likely take longer. You can alternate ice and heat to the bruising to help with resolution.  Apply heat to the right shoulder muscles to help them relax. Massage with icy hot or [place] will also help.  Don't be afraid to use the muscle relaxer or stronger pain medicine if needed. Follow-up as needed.

## 2015-08-28 DIAGNOSIS — S62324D Displaced fracture of shaft of fourth metacarpal bone, right hand, subsequent encounter for fracture with routine healing: Secondary | ICD-10-CM | POA: Diagnosis not present

## 2015-09-11 DIAGNOSIS — S62324D Displaced fracture of shaft of fourth metacarpal bone, right hand, subsequent encounter for fracture with routine healing: Secondary | ICD-10-CM | POA: Diagnosis not present

## 2015-09-15 ENCOUNTER — Other Ambulatory Visit: Payer: Self-pay | Admitting: Obstetrics & Gynecology

## 2015-09-15 DIAGNOSIS — Z853 Personal history of malignant neoplasm of breast: Secondary | ICD-10-CM

## 2015-09-28 DIAGNOSIS — S62324D Displaced fracture of shaft of fourth metacarpal bone, right hand, subsequent encounter for fracture with routine healing: Secondary | ICD-10-CM | POA: Diagnosis not present

## 2015-10-05 ENCOUNTER — Ambulatory Visit
Admission: RE | Admit: 2015-10-05 | Discharge: 2015-10-05 | Disposition: A | Discharge status: Home / Self Care | Payer: Medicare Other | Source: Ambulatory Visit | Attending: Obstetrics & Gynecology | Admitting: Obstetrics & Gynecology

## 2015-10-05 DIAGNOSIS — R922 Inconclusive mammogram: Secondary | ICD-10-CM | POA: Diagnosis not present

## 2015-10-05 DIAGNOSIS — Z853 Personal history of malignant neoplasm of breast: Secondary | ICD-10-CM

## 2015-10-16 DIAGNOSIS — R7301 Impaired fasting glucose: Secondary | ICD-10-CM | POA: Diagnosis not present

## 2015-10-16 DIAGNOSIS — M79641 Pain in right hand: Secondary | ICD-10-CM | POA: Diagnosis not present

## 2015-10-16 DIAGNOSIS — Z6823 Body mass index (BMI) 23.0-23.9, adult: Secondary | ICD-10-CM | POA: Diagnosis not present

## 2015-10-16 DIAGNOSIS — Z1389 Encounter for screening for other disorder: Secondary | ICD-10-CM | POA: Diagnosis not present

## 2015-10-19 DIAGNOSIS — Z124 Encounter for screening for malignant neoplasm of cervix: Secondary | ICD-10-CM | POA: Diagnosis not present

## 2015-10-19 DIAGNOSIS — Z6823 Body mass index (BMI) 23.0-23.9, adult: Secondary | ICD-10-CM | POA: Diagnosis not present

## 2015-10-19 DIAGNOSIS — Z01419 Encounter for gynecological examination (general) (routine) without abnormal findings: Secondary | ICD-10-CM | POA: Diagnosis not present

## 2015-11-10 DIAGNOSIS — M79641 Pain in right hand: Secondary | ICD-10-CM | POA: Diagnosis not present

## 2015-11-10 DIAGNOSIS — G8929 Other chronic pain: Secondary | ICD-10-CM | POA: Diagnosis not present

## 2015-12-07 DIAGNOSIS — M25641 Stiffness of right hand, not elsewhere classified: Secondary | ICD-10-CM | POA: Diagnosis not present

## 2015-12-07 DIAGNOSIS — G90511 Complex regional pain syndrome I of right upper limb: Secondary | ICD-10-CM | POA: Diagnosis not present

## 2015-12-07 DIAGNOSIS — G8929 Other chronic pain: Secondary | ICD-10-CM | POA: Diagnosis not present

## 2015-12-07 DIAGNOSIS — M79641 Pain in right hand: Secondary | ICD-10-CM | POA: Diagnosis not present

## 2015-12-14 DIAGNOSIS — M25641 Stiffness of right hand, not elsewhere classified: Secondary | ICD-10-CM | POA: Diagnosis not present

## 2015-12-14 DIAGNOSIS — G90511 Complex regional pain syndrome I of right upper limb: Secondary | ICD-10-CM | POA: Diagnosis not present

## 2015-12-17 DIAGNOSIS — G90511 Complex regional pain syndrome I of right upper limb: Secondary | ICD-10-CM | POA: Diagnosis not present

## 2015-12-22 ENCOUNTER — Other Ambulatory Visit (HOSPITAL_BASED_OUTPATIENT_CLINIC_OR_DEPARTMENT_OTHER): Payer: Medicare Other

## 2015-12-22 DIAGNOSIS — C50411 Malignant neoplasm of upper-outer quadrant of right female breast: Secondary | ICD-10-CM

## 2015-12-22 DIAGNOSIS — E559 Vitamin D deficiency, unspecified: Secondary | ICD-10-CM

## 2015-12-22 LAB — CBC WITH DIFFERENTIAL/PLATELET
BASO%: 0.2 % (ref 0.0–2.0)
BASOS ABS: 0 10*3/uL (ref 0.0–0.1)
EOS ABS: 0.1 10*3/uL (ref 0.0–0.5)
EOS%: 2.4 % (ref 0.0–7.0)
HEMATOCRIT: 36.3 % (ref 34.8–46.6)
HGB: 12.6 g/dL (ref 11.6–15.9)
LYMPH%: 41 % (ref 14.0–49.7)
MCH: 26.3 pg (ref 25.1–34.0)
MCHC: 34.7 g/dL (ref 31.5–36.0)
MCV: 75.8 fL — AB (ref 79.5–101.0)
MONO#: 0.5 10*3/uL (ref 0.1–0.9)
MONO%: 11.4 % (ref 0.0–14.0)
NEUT#: 2.1 10*3/uL (ref 1.5–6.5)
NEUT%: 45 % (ref 38.4–76.8)
Platelets: 284 10*3/uL (ref 145–400)
RBC: 4.79 10*6/uL (ref 3.70–5.45)
RDW: 15.4 % — ABNORMAL HIGH (ref 11.2–14.5)
WBC: 4.6 10*3/uL (ref 3.9–10.3)
lymph#: 1.9 10*3/uL (ref 0.9–3.3)

## 2015-12-22 LAB — COMPREHENSIVE METABOLIC PANEL
ALT: 10 U/L (ref 0–55)
ANION GAP: 11 meq/L (ref 3–11)
AST: 19 U/L (ref 5–34)
Albumin: 3.8 g/dL (ref 3.5–5.0)
Alkaline Phosphatase: 76 U/L (ref 40–150)
BUN: 17.4 mg/dL (ref 7.0–26.0)
CALCIUM: 9.7 mg/dL (ref 8.4–10.4)
CHLORIDE: 102 meq/L (ref 98–109)
CO2: 27 mEq/L (ref 22–29)
CREATININE: 0.9 mg/dL (ref 0.6–1.1)
EGFR: 80 mL/min/{1.73_m2} — AB (ref >90)
Glucose: 99 mg/dl (ref 70–140)
Potassium: 4.1 mEq/L (ref 3.5–5.1)
Sodium: 141 mEq/L (ref 136–145)
Total Bilirubin: 0.5 mg/dL (ref 0.20–1.20)
Total Protein: 7.9 g/dL (ref 6.4–8.3)

## 2015-12-24 DIAGNOSIS — G8929 Other chronic pain: Secondary | ICD-10-CM | POA: Diagnosis not present

## 2015-12-24 DIAGNOSIS — M25641 Stiffness of right hand, not elsewhere classified: Secondary | ICD-10-CM | POA: Diagnosis not present

## 2015-12-24 DIAGNOSIS — M79641 Pain in right hand: Secondary | ICD-10-CM | POA: Diagnosis not present

## 2015-12-24 DIAGNOSIS — G90511 Complex regional pain syndrome I of right upper limb: Secondary | ICD-10-CM | POA: Diagnosis not present

## 2015-12-29 ENCOUNTER — Ambulatory Visit: Payer: Self-pay | Admitting: Oncology

## 2015-12-29 ENCOUNTER — Encounter: Payer: Self-pay | Admitting: Oncology

## 2015-12-29 ENCOUNTER — Ambulatory Visit (HOSPITAL_BASED_OUTPATIENT_CLINIC_OR_DEPARTMENT_OTHER): Payer: Medicare Other | Admitting: Oncology

## 2015-12-29 VITALS — BP 123/63 | HR 76 | Temp 98.2°F | Resp 18 | Ht 64.0 in | Wt 140.0 lb

## 2015-12-29 DIAGNOSIS — C50411 Malignant neoplasm of upper-outer quadrant of right female breast: Secondary | ICD-10-CM | POA: Diagnosis not present

## 2015-12-29 DIAGNOSIS — E559 Vitamin D deficiency, unspecified: Secondary | ICD-10-CM

## 2015-12-29 DIAGNOSIS — Z170 Estrogen receptor positive status [ER+]: Secondary | ICD-10-CM | POA: Diagnosis not present

## 2015-12-29 DIAGNOSIS — Z79811 Long term (current) use of aromatase inhibitors: Secondary | ICD-10-CM

## 2015-12-29 MED ORDER — ANASTROZOLE 1 MG PO TABS: 1.0000 mg | ORAL_TABLET | Freq: Every day | ORAL | 4 refills | Status: DC
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
## 2015-12-29 NOTE — Progress Notes (Signed)
Faxed script for arimidex to [facility] [phone]

## 2016-01-12 DIAGNOSIS — M545 Low back pain: Secondary | ICD-10-CM | POA: Diagnosis not present

## 2016-01-12 DIAGNOSIS — M9903 Segmental and somatic dysfunction of lumbar region: Secondary | ICD-10-CM | POA: Diagnosis not present

## 2016-01-12 DIAGNOSIS — M9901 Segmental and somatic dysfunction of cervical region: Secondary | ICD-10-CM | POA: Diagnosis not present

## 2016-01-12 DIAGNOSIS — M542 Cervicalgia: Secondary | ICD-10-CM | POA: Diagnosis not present

## 2016-01-13 ENCOUNTER — Telehealth: Payer: Self-pay | Admitting: General Practice

## 2016-01-13 NOTE — Telephone Encounter (Signed)
Left message regarding October 2018 appt.

## 2016-02-02 DIAGNOSIS — G8929 Other chronic pain: Secondary | ICD-10-CM | POA: Diagnosis not present

## 2016-02-02 DIAGNOSIS — G5641 Causalgia of right upper limb: Secondary | ICD-10-CM | POA: Diagnosis not present

## 2016-02-02 DIAGNOSIS — M79641 Pain in right hand: Secondary | ICD-10-CM | POA: Diagnosis not present

## 2016-02-09 DIAGNOSIS — M9903 Segmental and somatic dysfunction of lumbar region: Secondary | ICD-10-CM | POA: Diagnosis not present

## 2016-02-09 DIAGNOSIS — M545 Low back pain: Secondary | ICD-10-CM | POA: Diagnosis not present

## 2016-02-09 DIAGNOSIS — M9901 Segmental and somatic dysfunction of cervical region: Secondary | ICD-10-CM | POA: Diagnosis not present

## 2016-02-09 DIAGNOSIS — M542 Cervicalgia: Secondary | ICD-10-CM | POA: Diagnosis not present

## 2016-04-07 ENCOUNTER — Telehealth: Payer: Self-pay | Admitting: Oncology

## 2016-04-07 NOTE — Telephone Encounter (Signed)
Patient said she has called several times and left messages and no one has called her back she needs her prescription information changed and she is almost out of medicine her call back number is [phone]

## 2016-04-11 ENCOUNTER — Other Ambulatory Visit: Payer: Self-pay | Admitting: Emergency Medicine

## 2016-04-11 DIAGNOSIS — E559 Vitamin D deficiency, unspecified: Secondary | ICD-10-CM

## 2016-04-11 MED ORDER — ANASTROZOLE 1 MG PO TABS: 1.0000 mg | ORAL_TABLET | Freq: Every day | ORAL | 3 refills | Status: DC

## 2016-04-12 DIAGNOSIS — R7301 Impaired fasting glucose: Secondary | ICD-10-CM | POA: Diagnosis not present

## 2016-04-12 DIAGNOSIS — M859 Disorder of bone density and structure, unspecified: Secondary | ICD-10-CM | POA: Diagnosis not present

## 2016-04-12 DIAGNOSIS — E784 Other hyperlipidemia: Secondary | ICD-10-CM | POA: Diagnosis not present

## 2016-04-12 DIAGNOSIS — R8299 Other abnormal findings in urine: Secondary | ICD-10-CM | POA: Diagnosis not present

## 2016-04-12 DIAGNOSIS — E038 Other specified hypothyroidism: Secondary | ICD-10-CM | POA: Diagnosis not present

## 2016-04-18 DIAGNOSIS — R69 Illness, unspecified: Secondary | ICD-10-CM | POA: Diagnosis not present

## 2016-04-19 DIAGNOSIS — Z6823 Body mass index (BMI) 23.0-23.9, adult: Secondary | ICD-10-CM | POA: Diagnosis not present

## 2016-04-19 DIAGNOSIS — G9050 Complex regional pain syndrome I, unspecified: Secondary | ICD-10-CM | POA: Diagnosis not present

## 2016-04-19 DIAGNOSIS — E559 Vitamin D deficiency, unspecified: Secondary | ICD-10-CM | POA: Diagnosis not present

## 2016-04-19 DIAGNOSIS — E038 Other specified hypothyroidism: Secondary | ICD-10-CM | POA: Diagnosis not present

## 2016-04-19 DIAGNOSIS — C50919 Malignant neoplasm of unspecified site of unspecified female breast: Secondary | ICD-10-CM | POA: Diagnosis not present

## 2016-04-19 DIAGNOSIS — Z0000 Encounter for general adult medical examination without abnormal findings: Secondary | ICD-10-CM | POA: Diagnosis not present

## 2016-04-19 DIAGNOSIS — M859 Disorder of bone density and structure, unspecified: Secondary | ICD-10-CM | POA: Diagnosis not present

## 2016-04-19 DIAGNOSIS — R7301 Impaired fasting glucose: Secondary | ICD-10-CM | POA: Diagnosis not present

## 2016-04-19 DIAGNOSIS — R69 Illness, unspecified: Secondary | ICD-10-CM | POA: Diagnosis not present

## 2016-04-19 DIAGNOSIS — E784 Other hyperlipidemia: Secondary | ICD-10-CM | POA: Diagnosis not present

## 2016-04-29 DIAGNOSIS — C50912 Malignant neoplasm of unspecified site of left female breast: Secondary | ICD-10-CM | POA: Diagnosis not present

## 2016-05-13 DIAGNOSIS — H2510 Age-related nuclear cataract, unspecified eye: Secondary | ICD-10-CM | POA: Diagnosis not present

## 2016-05-13 DIAGNOSIS — Z0100 Encounter for examination of eyes and vision without abnormal findings: Secondary | ICD-10-CM | POA: Diagnosis not present

## 2016-05-20 DIAGNOSIS — Z1212 Encounter for screening for malignant neoplasm of rectum: Secondary | ICD-10-CM | POA: Diagnosis not present

## 2016-05-26 DIAGNOSIS — G5641 Causalgia of right upper limb: Secondary | ICD-10-CM | POA: Diagnosis not present

## 2016-06-23 DIAGNOSIS — G5641 Causalgia of right upper limb: Secondary | ICD-10-CM | POA: Diagnosis not present

## 2016-07-07 DIAGNOSIS — Z6822 Body mass index (BMI) 22.0-22.9, adult: Secondary | ICD-10-CM | POA: Diagnosis not present

## 2016-07-07 DIAGNOSIS — Z79899 Other long term (current) drug therapy: Secondary | ICD-10-CM | POA: Diagnosis not present

## 2016-07-07 DIAGNOSIS — R69 Illness, unspecified: Secondary | ICD-10-CM | POA: Diagnosis not present

## 2016-07-07 DIAGNOSIS — G90511 Complex regional pain syndrome I of right upper limb: Secondary | ICD-10-CM | POA: Diagnosis not present

## 2016-07-07 DIAGNOSIS — D0590 Unspecified type of carcinoma in situ of unspecified breast: Secondary | ICD-10-CM | POA: Diagnosis not present

## 2016-07-07 DIAGNOSIS — E039 Hypothyroidism, unspecified: Secondary | ICD-10-CM | POA: Diagnosis not present

## 2016-07-07 DIAGNOSIS — Z79811 Long term (current) use of aromatase inhibitors: Secondary | ICD-10-CM | POA: Diagnosis not present

## 2016-07-07 DIAGNOSIS — Z0000 Encounter for general adult medical examination without abnormal findings: Secondary | ICD-10-CM | POA: Diagnosis not present

## 2016-07-07 DIAGNOSIS — R002 Palpitations: Secondary | ICD-10-CM | POA: Diagnosis not present

## 2016-07-15 ENCOUNTER — Encounter: Payer: Self-pay | Admitting: Neurology

## 2016-07-18 DIAGNOSIS — E038 Other specified hypothyroidism: Secondary | ICD-10-CM | POA: Diagnosis not present

## 2016-09-29 ENCOUNTER — Encounter: Payer: Self-pay | Admitting: Neurology

## 2016-09-29 ENCOUNTER — Ambulatory Visit (INDEPENDENT_AMBULATORY_CARE_PROVIDER_SITE_OTHER): Payer: Medicare HMO | Admitting: Neurology

## 2016-09-29 VITALS — BP 120/68 | HR 80 | Ht 64.0 in | Wt 135.1 lb

## 2016-09-29 DIAGNOSIS — M25641 Stiffness of right hand, not elsewhere classified: Secondary | ICD-10-CM

## 2016-09-29 NOTE — Patient Instructions (Signed)
I do not see any evidence that your hand stiffness is stemming from a primary neurological condition.  I recommend follow-up with Dr. Burney Gauze at the [facility] for further recommendation.
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
## 2016-10-07 ENCOUNTER — Ambulatory Visit: Payer: Medicare Other | Admitting: Neurology

## 2016-10-14 ENCOUNTER — Ambulatory Visit
Admission: RE | Admit: 2016-10-14 | Discharge: 2016-10-14 | Disposition: A | Discharge status: Home / Self Care | Payer: Medicare HMO | Source: Ambulatory Visit | Attending: Oncology | Admitting: Oncology

## 2016-10-14 DIAGNOSIS — Z170 Estrogen receptor positive status [ER+]: Secondary | ICD-10-CM

## 2016-10-14 DIAGNOSIS — C50411 Malignant neoplasm of upper-outer quadrant of right female breast: Secondary | ICD-10-CM

## 2016-10-14 DIAGNOSIS — R928 Other abnormal and inconclusive findings on diagnostic imaging of breast: Secondary | ICD-10-CM | POA: Diagnosis not present

## 2016-10-14 HISTORY — DX: Personal history of irradiation: Z92.3

## 2016-10-19 DIAGNOSIS — Z6822 Body mass index (BMI) 22.0-22.9, adult: Secondary | ICD-10-CM | POA: Diagnosis not present

## 2016-10-19 DIAGNOSIS — Z124 Encounter for screening for malignant neoplasm of cervix: Secondary | ICD-10-CM | POA: Diagnosis not present

## 2016-10-19 DIAGNOSIS — Z0142 Encounter for cervical smear to confirm findings of recent normal smear following initial abnormal smear: Secondary | ICD-10-CM | POA: Diagnosis not present

## 2016-10-25 DIAGNOSIS — R69 Illness, unspecified: Secondary | ICD-10-CM | POA: Diagnosis not present

## 2016-11-28 DIAGNOSIS — S62354D Nondisplaced fracture of shaft of fourth metacarpal bone, right hand, subsequent encounter for fracture with routine healing: Secondary | ICD-10-CM | POA: Diagnosis not present

## 2016-11-28 DIAGNOSIS — M25641 Stiffness of right hand, not elsewhere classified: Secondary | ICD-10-CM | POA: Diagnosis not present

## 2016-12-02 DIAGNOSIS — M25641 Stiffness of right hand, not elsewhere classified: Secondary | ICD-10-CM | POA: Diagnosis not present

## 2016-12-02 DIAGNOSIS — S62354S Nondisplaced fracture of shaft of fourth metacarpal bone, right hand, sequela: Secondary | ICD-10-CM | POA: Diagnosis not present

## 2016-12-23 DIAGNOSIS — M25641 Stiffness of right hand, not elsewhere classified: Secondary | ICD-10-CM | POA: Diagnosis not present

## 2016-12-23 DIAGNOSIS — S62354S Nondisplaced fracture of shaft of fourth metacarpal bone, right hand, sequela: Secondary | ICD-10-CM | POA: Diagnosis not present

## 2016-12-27 NOTE — Progress Notes (Signed)
[facility]  [phone] [phone]) [phone]    ID: Dominique Adams   DOB: August 24, 1950  MR#: [id number]  PYY#:[id number]  PCP: Marton Redwood, MD GYN: Dory Horn SU: Osborn Coho OTHER MD: Arloa Koh  CHIEF COMPLAINT: Estrogen receptor positive breast cancer  CURRENT TREATMENT: anastrozole   BREAST CANCER HISTORY: From the original intake note:  Dominique Adams had a screening mammogram on 09/16/2011 which showed have suspicious calcifications within the right breast leading [facility]. This confirmed the suspicious calcifications within the upper-outer quadrant of the right breast and a stereotactic core biopsy the same day was diagnostic for invasive mammary carcinoma, favoring invasive ductal along with DCIS. Her carcinoma was strongly ER positive at 100% and PR positive at 95% with a low proliferation Ki-67 of 10%. HER-2/neu was not amplified. Breast MRI on 10/10/2011 showed a 1.9 cm area of enhancement consistent with post biopsy change.She is also felt to have a 7 mm intramammary node 5 mm posterior to the biopsy site, unchanged. On 10/28/2011 the patient underwent right lumpectomy and sentinel lymph node sampling, the final pathology ([id number]) showing an invasive ductal carcinoma, grade 1, measuring 0.9 cm, with all 4 sentinel lymph nodes clear. Repeat HER-2 testing again showed no amplification. Her subsequent history is as detailed below.  INTERVAL HISTORY: Dominique Adams returns today for follow-up and treatment of her estrogen receptor positive breast cancer. She continues on anastrozole, and is tolerating it well. She does have some vaginal dryness and is interested in pelvic health. She denies hot flashes.   REVIEW OF SYSTEMS: Dominique Adams has been great. She spends her time quilting and with her two grandchildren. She notes the only form of exercise she gets is going up and down the stairs in her home. She has thought about  joining silver sneakers, but hasn't joined yet. She denies unusual headaches, visual changes, nausea, vomiting, or dizziness. There has been no unusual cough, phlegm production, or pleurisy. This been no change in bowel or bladder habits. She denies unexplained fatigue or unexplained weight loss, bleeding, rash, or fever. A detailed review of systems was otherwise entirely stable.   PAST MEDICAL HISTORY: Past Medical History:  Diagnosis Date  . Anxiety   . Arthritis   . Blood transfusion    no surgery  . Breast cancer ([place]) 10/04/11   right breast=invasive mammary ca,ductal ca in situ w/calcifications,ER/PR=Positive  . Dysrhythmia    used to see dr tennant-he put her on meds-released her 2011  . History of radiation therapy 12/06/11-01/02/12   right breast,4250cGy/17sessions,boost=750cGy/3sesions  . Personal history of radiation therapy   . Thyroid disease   . Wears glasses     PAST SURGICAL HISTORY: Past Surgical History:  Procedure Laterality Date  . BREAST LUMPECTOMY Right 2013  . COLONOSCOPY  2009   neg  . CYSTECTOMY  1995   lt ankle  . right breast bx  10/04/11   righy uoq bx=invasive mammary ca,dcis w calcifications  . right breast lumpectomy  10/28/11   right,invasive ductal ca,dcis,margins not involved,1 benign lympgh node axilla    FAMILY HISTORY Family History  Problem Relation Age of Onset  . Arrhythmia Father 41  . Hypertension Father   . Diabetes Father   . Cancer Father        prostate ca  . Arrhythmia Mother 41  . Hypertension Mother        chf  . Diabetes Mother   . Cancer Mother  lung  . Diabetes Brother   . Renal Disease Brother    The patient's father died at the age of 27 from heart disease. He had a history of prostate cancer. The patient's mother died at the age of 66, with lung cancer. She also had significant congestive heart failure. She was a smoker. The patient has 3 brothers and one sister. One brother died secondary to suicide with a  history of depression. There is no history of breast or ovarian cancer in the immediate family  GYNECOLOGIC HISTORY: Menarche age 28, menopause 2003. First live birth age 81. She is GX P0. She took hormone replacement only for approximately 6 months.  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 Her husband Jiles Prows is in Art gallery manager. Son Derald Macleod works for [employer] in [place] and son Christen Bame does music videos. The patient has 2 grandchildren. She attends [employer].   ADVANCED DIRECTIVES: In place  HEALTH MAINTENANCE: Social History  Substance Use Topics  . Smoking status: Never Smoker  . Smokeless tobacco: Never Used  . Alcohol use No     Colonoscopy: Verl Blalock  PAP: Dory Horn  Bone density:  Due in July 2014  Lipid panel:  No Known Allergies  Current Outpatient Prescriptions  Medication Sig Dispense Refill  . Calcium Carb-Cholecalciferol (CALCIUM-VITAMIN D3) 600-500 MG-UNIT CAPS Take 1,500 mg by mouth daily.     . fish oil-omega-3 fatty acids 1000 MG capsule Take 2 g by mouth daily.    Marland Kitchen levothyroxine (SYNTHROID, LEVOTHROID) 50 MCG tablet Take 50 mcg by mouth daily.    Marland Kitchen PARoxetine (PAXIL) 20 MG tablet Take 20 mg by mouth every morning.     No current facility-administered medications for this visit.     OBJECTIVE: Middle-aged African American in no acute distress  Vitals:   12/29/16 1218  BP: (!) 159/66  Pulse: 87  Resp: 18  Temp: 98.4 F (36.9 C)  SpO2: 100%     Body mass index is 22.69 kg/m.    ECOG FS: 0 Filed Weights   12/29/16 1218  Weight: 132 lb 3.2 oz (60 kg)   Sclerae unicteric, EOMs intact Oropharynx clear and moist No cervical or supraclavicular adenopathy Lungs no rales or rhonchi Heart regular rate and rhythm Abd soft, nontender, positive bowel sounds MSK no focal spinal tenderness, no upper extremity lymphedema Neuro: nonfocal, well oriented, appropriate affect Breasts: On the right  she is status post lumpectomy and radiation. There is no evidence of local recurrence. The left breast is unremarkable. Both axillae are benign.  LAB RESULTS:  Lab Results  Component Value Date   WBC 4.4 12/29/2016   NEUTROABS 2.4 12/29/2016   HGB 12.7 12/29/2016   HCT 38.7 12/29/2016   MCV 78.1 (L) 12/29/2016   PLT 301 12/29/2016      Chemistry      Component Value Date/Time   NA 141 12/22/2015 1018   K 4.1 12/22/2015 1018   CL 103 06/27/2012 1049   CO2 27 12/22/2015 1018   BUN 17.4 12/22/2015 1018   CREATININE 0.9 12/22/2015 1018      Component Value Date/Time   CALCIUM 9.7 12/22/2015 1018   ALKPHOS 76 12/22/2015 1018   AST 19 12/22/2015 1018   ALT 10 12/22/2015 1018   BILITOT 0.50 12/22/2015 1018       Lab Results  Component Value Date   LABCA2 15 01/04/2012     STUDIES: Diagnostic Bilateral Breast Mammogram TOMO, 10/14/2016,  breast density category B, shows no evidence of malignancy.   ASSESSMENT: 66 y.o. [place] woman status post right lumpectomy 10/28/2011 for a pT1b pN0, stage IA invasive ductal carcinoma, grade 1, strongly estrogen and progesterone receptor positive, with an MIB-1 of 10% and no HER-2 amplification  (1) Oncotype DX showed a score of 18 predicting a 10 year risk of distant recurrence of 12% if the patient's only systemic treatment consisted of 5 years of tamoxifen  (2) adjuvant radiation was completed 01/02/2012  (3) anastrozole started November 2013, completing 5 years October 2018  (4) [facility]  (5) alpha thalassemia trait: Ferritin normal on 12/19/13. MCV chronically low.    PLAN:  Dallana is now a little over 5 years out from definitive surgery for her breast cancer with no evidence of disease recurrence. This is very favorable.  She has completed 5 years of aromatase inhibitors. In patients like her the benefit of an additional 2 years is negligible. Accordingly she is going off  anastrozole now.  At this point I feel comfortable releasing her to her primary care physician. All she will need for breast cancer screening is a yearly physician breast exam and yearly mammography, preferably with tomography.  I will be glad to see Diane at any point in the future if on when the need arises but as of now we are making no further routine appointment for her here.   Magrinat, Virgie Dad, MD  12/29/16 12:21 PM Medical Oncology and Hematology [facility] [address] [place], [place] [address] Tel. [phone]    Fax. [phone]  This document serves as a record of services personally performed by Chauncey Cruel, MD. It was created on her behalf by Margit Banda, a trained medical scribe. The creation of this record is based on the scribe's personal observations and the provider's statements to them. This document has been checked and approved by the attending provider.

## 2016-12-29 ENCOUNTER — Encounter: Payer: Self-pay | Admitting: Oncology

## 2016-12-29 ENCOUNTER — Other Ambulatory Visit (HOSPITAL_BASED_OUTPATIENT_CLINIC_OR_DEPARTMENT_OTHER): Payer: Medicare HMO

## 2016-12-29 ENCOUNTER — Other Ambulatory Visit: Payer: Self-pay | Admitting: *Deleted

## 2016-12-29 ENCOUNTER — Ambulatory Visit (HOSPITAL_BASED_OUTPATIENT_CLINIC_OR_DEPARTMENT_OTHER): Payer: Medicare HMO | Admitting: Oncology

## 2016-12-29 VITALS — BP 159/66 | HR 87 | Temp 98.4°F | Resp 18 | Ht 64.0 in | Wt 132.2 lb

## 2016-12-29 DIAGNOSIS — C50411 Malignant neoplasm of upper-outer quadrant of right female breast: Secondary | ICD-10-CM

## 2016-12-29 DIAGNOSIS — Z170 Estrogen receptor positive status [ER+]: Secondary | ICD-10-CM

## 2016-12-29 DIAGNOSIS — Z79811 Long term (current) use of aromatase inhibitors: Secondary | ICD-10-CM | POA: Diagnosis not present

## 2016-12-29 LAB — COMPREHENSIVE METABOLIC PANEL
ALT: 10 U/L (ref 0–55)
AST: 20 U/L (ref 5–34)
Albumin: 3.7 g/dL (ref 3.5–5.0)
Alkaline Phosphatase: 83 U/L (ref 40–150)
Anion Gap: 9 mEq/L (ref 3–11)
BUN: 13.5 mg/dL (ref 7.0–26.0)
CO2: 28 meq/L (ref 22–29)
Calcium: 9.6 mg/dL (ref 8.4–10.4)
Chloride: 104 mEq/L (ref 98–109)
Creatinine: 0.8 mg/dL (ref 0.6–1.1)
GLUCOSE: 117 mg/dL (ref 70–140)
POTASSIUM: 4.1 meq/L (ref 3.5–5.1)
SODIUM: 141 meq/L (ref 136–145)
Total Bilirubin: 0.47 mg/dL (ref 0.20–1.20)
Total Protein: 7.5 g/dL (ref 6.4–8.3)

## 2016-12-29 LAB — CBC WITH DIFFERENTIAL/PLATELET
BASO%: 1.4 % (ref 0.0–2.0)
Basophils Absolute: 0.1 10*3/uL (ref 0.0–0.1)
EOS ABS: 0 10*3/uL (ref 0.0–0.5)
EOS%: 0.8 % (ref 0.0–7.0)
HCT: 38.7 % (ref 34.8–46.6)
HGB: 12.7 g/dL (ref 11.6–15.9)
LYMPH%: 32.2 % (ref 14.0–49.7)
MCH: 25.7 pg (ref 25.1–34.0)
MCHC: 32.9 g/dL (ref 31.5–36.0)
MCV: 78.1 fL — AB (ref 79.5–101.0)
MONO#: 0.4 10*3/uL (ref 0.1–0.9)
MONO%: 10 % (ref 0.0–14.0)
NEUT%: 55.6 % (ref 38.4–76.8)
NEUTROS ABS: 2.4 10*3/uL (ref 1.5–6.5)
Platelets: 301 10*3/uL (ref 145–400)
RBC: 4.95 10*6/uL (ref 3.70–5.45)
RDW: 16 % — ABNORMAL HIGH (ref 11.2–14.5)
WBC: 4.4 10*3/uL (ref 3.9–10.3)
lymph#: 1.4 10*3/uL (ref 0.9–3.3)

## 2017-01-03 ENCOUNTER — Ambulatory Visit: Payer: Medicare HMO | Admitting: Physical Therapy

## 2017-03-13 DIAGNOSIS — H5210 Myopia, unspecified eye: Secondary | ICD-10-CM | POA: Diagnosis not present

## 2017-04-13 DIAGNOSIS — N760 Acute vaginitis: Secondary | ICD-10-CM | POA: Diagnosis not present

## 2017-04-13 DIAGNOSIS — N952 Postmenopausal atrophic vaginitis: Secondary | ICD-10-CM | POA: Diagnosis not present

## 2017-04-13 DIAGNOSIS — N390 Urinary tract infection, site not specified: Secondary | ICD-10-CM | POA: Diagnosis not present

## 2017-04-13 DIAGNOSIS — E7849 Other hyperlipidemia: Secondary | ICD-10-CM | POA: Diagnosis not present

## 2017-04-13 DIAGNOSIS — N941 Unspecified dyspareunia: Secondary | ICD-10-CM | POA: Diagnosis not present

## 2017-04-13 DIAGNOSIS — R82998 Other abnormal findings in urine: Secondary | ICD-10-CM | POA: Diagnosis not present

## 2017-04-13 DIAGNOSIS — E038 Other specified hypothyroidism: Secondary | ICD-10-CM | POA: Diagnosis not present

## 2017-04-13 DIAGNOSIS — R7301 Impaired fasting glucose: Secondary | ICD-10-CM | POA: Diagnosis not present

## 2017-04-13 DIAGNOSIS — M859 Disorder of bone density and structure, unspecified: Secondary | ICD-10-CM | POA: Diagnosis not present

## 2017-04-18 DIAGNOSIS — R102 Pelvic and perineal pain: Secondary | ICD-10-CM | POA: Diagnosis not present

## 2017-04-20 DIAGNOSIS — Z6822 Body mass index (BMI) 22.0-22.9, adult: Secondary | ICD-10-CM | POA: Diagnosis not present

## 2017-04-20 DIAGNOSIS — Z0000 Encounter for general adult medical examination without abnormal findings: Secondary | ICD-10-CM | POA: Diagnosis not present

## 2017-04-20 DIAGNOSIS — R69 Illness, unspecified: Secondary | ICD-10-CM | POA: Diagnosis not present

## 2017-04-20 DIAGNOSIS — Z853 Personal history of malignant neoplasm of breast: Secondary | ICD-10-CM | POA: Diagnosis not present

## 2017-04-20 DIAGNOSIS — M8580 Other specified disorders of bone density and structure, unspecified site: Secondary | ICD-10-CM | POA: Diagnosis not present

## 2017-04-20 DIAGNOSIS — E038 Other specified hypothyroidism: Secondary | ICD-10-CM | POA: Diagnosis not present

## 2017-04-20 DIAGNOSIS — R7301 Impaired fasting glucose: Secondary | ICD-10-CM | POA: Diagnosis not present

## 2017-04-20 DIAGNOSIS — E7849 Other hyperlipidemia: Secondary | ICD-10-CM | POA: Diagnosis not present

## 2017-04-20 DIAGNOSIS — E559 Vitamin D deficiency, unspecified: Secondary | ICD-10-CM | POA: Diagnosis not present

## 2017-04-20 DIAGNOSIS — R002 Palpitations: Secondary | ICD-10-CM | POA: Diagnosis not present

## 2017-04-24 ENCOUNTER — Encounter: Payer: Self-pay | Admitting: Gastroenterology

## 2017-04-27 DIAGNOSIS — Z1212 Encounter for screening for malignant neoplasm of rectum: Secondary | ICD-10-CM | POA: Diagnosis not present

## 2017-05-02 DIAGNOSIS — R69 Illness, unspecified: Secondary | ICD-10-CM | POA: Diagnosis not present

## 2017-05-29 DIAGNOSIS — N952 Postmenopausal atrophic vaginitis: Secondary | ICD-10-CM | POA: Diagnosis not present

## 2017-06-30 ENCOUNTER — Ambulatory Visit (AMBULATORY_SURGERY_CENTER): Payer: Self-pay

## 2017-06-30 ENCOUNTER — Encounter: Payer: Self-pay | Admitting: Gastroenterology

## 2017-06-30 ENCOUNTER — Other Ambulatory Visit: Payer: Self-pay

## 2017-06-30 VITALS — Ht 64.0 in | Wt 130.6 lb

## 2017-06-30 DIAGNOSIS — Z1211 Encounter for screening for malignant neoplasm of colon: Secondary | ICD-10-CM

## 2017-06-30 MED ORDER — PEG 3350-KCL-NA BICARB-NACL 420 G PO SOLR: 4000.0000 mL | Freq: Once | ORAL | 0 refills | Status: AC

## 2017-06-30 NOTE — Progress Notes (Signed)
Denies allergies to eggs or soy products. Denies complication of anesthesia or sedation. Denies use of weight loss medication. Denies use of O2.   Emmi instructions declined.  

## 2017-07-14 ENCOUNTER — Ambulatory Visit (AMBULATORY_SURGERY_CENTER): Payer: Medicare HMO | Admitting: Gastroenterology

## 2017-07-14 ENCOUNTER — Encounter: Payer: Self-pay | Admitting: Gastroenterology

## 2017-07-14 ENCOUNTER — Other Ambulatory Visit: Payer: Self-pay

## 2017-07-14 VITALS — BP 123/50 | HR 64 | Temp 98.6°F | Resp 20 | Ht 64.0 in | Wt 130.0 lb

## 2017-07-14 DIAGNOSIS — Z1212 Encounter for screening for malignant neoplasm of rectum: Secondary | ICD-10-CM

## 2017-07-14 DIAGNOSIS — Z1211 Encounter for screening for malignant neoplasm of colon: Secondary | ICD-10-CM

## 2017-07-14 DIAGNOSIS — E039 Hypothyroidism, unspecified: Secondary | ICD-10-CM | POA: Diagnosis not present

## 2017-07-14 DIAGNOSIS — Z86010 Personal history of colonic polyps: Secondary | ICD-10-CM | POA: Diagnosis not present

## 2017-07-14 MED ORDER — SODIUM CHLORIDE 0.9 % IV SOLN: 500.0000 mL | Freq: Once | INTRAVENOUS | Status: AC

## 2017-07-14 NOTE — Progress Notes (Signed)
Pt's states no medical or surgical changes since previsit or office visit. 

## 2017-07-14 NOTE — Op Note (Signed)
[facility] Patient Name: Dominique Adams Procedure Date: 07/14/2017 10:06 AM MRN: [id number] Endoscopist: Milus Banister , MD Age: 67 Referring MD:  Date of Birth: Aug 30, 1950 Gender: Female Account #: [id number] Procedure:                Colonoscopy Indications:              Screening for colorectal malignant neoplasm Medicines:                Monitored Anesthesia Care Procedure:                Pre-Anesthesia Assessment:                           - Prior to the procedure, a History and Physical                            was performed, and patient medications and                            allergies were reviewed. The patient's tolerance of                            previous anesthesia was also reviewed. The risks                            and benefits of the procedure and the sedation                            options and risks were discussed with the patient.                            All questions were answered, and informed consent                            was obtained. Prior Anticoagulants: The patient has                            taken no previous anticoagulant or antiplatelet                            agents. ASA Grade Assessment: II - A patient with                            mild systemic disease. After reviewing the risks                            and benefits, the patient was deemed in                            satisfactory condition to undergo the procedure.                           After obtaining informed consent, the colonoscope  was passed under direct vision. Throughout the                            procedure, the patient's blood pressure, pulse, and                            oxygen saturations were monitored continuously. The                            Colonoscope was introduced through the anus and                            advanced to the the cecum, identified by                            appendiceal orifice and  ileocecal valve. The                            colonoscopy was performed without difficulty. The                            patient tolerated the procedure well. The quality                            of the bowel preparation was good. The ileocecal                            valve, appendiceal orifice, and rectum were                            photographed. Scope In: 10:16:26 AM Scope Out: 10:27:43 AM Scope Withdrawal Time: 0 hours 8 minutes 16 seconds  Total Procedure Duration: 0 hours 11 minutes 17 seconds  Findings:                 The entire examined colon appeared normal on direct                            and retroflexion views. Complications:            No immediate complications. Estimated blood loss:                            None. Estimated Blood Loss:     Estimated blood loss: none. Impression:               - The entire examined colon is normal on direct and                            retroflexion views.                           - No polyps or cancers. Recommendation:           - Patient has a contact number available for  emergencies. The signs and symptoms of potential                            delayed complications were discussed with the                            patient. Return to normal activities tomorrow.                            Written discharge instructions were provided to the                            patient.                           - Resume previous diet.                           - Continue present medications.                           - Repeat colonoscopy in 10 years for screening. Milus Banister, MD 07/14/2017 10:29:15 AM This report has been signed electronically.

## 2017-07-14 NOTE — Patient Instructions (Signed)
YOU HAD AN ENDOSCOPIC PROCEDURE TODAY AT THE [facility]:   Refer to the procedure report that was given to you for any specific questions about what was found during the examination.  If the procedure report does not answer your questions, please call your gastroenterologist to clarify.  If you requested that your care partner not be given the details of your procedure findings, then the procedure report has been included in a sealed envelope for you to review at your convenience later.  YOU SHOULD EXPECT: Some feelings of bloating in the abdomen. Passage of more gas than usual.  Walking can help get rid of the air that was put into your GI tract during the procedure and reduce the bloating. If you had a lower endoscopy (such as a colonoscopy or flexible sigmoidoscopy) you may notice spotting of blood in your stool or on the toilet paper. If you underwent a bowel prep for your procedure, you may not have a normal bowel movement for a few days.  Please Note:  You might notice some irritation and congestion in your nose or some drainage.  This is from the oxygen used during your procedure.  There is no need for concern and it should clear up in a day or so.  SYMPTOMS TO REPORT IMMEDIATELY:   Following lower endoscopy (colonoscopy or flexible sigmoidoscopy):  Excessive amounts of blood in the stool  Significant tenderness or worsening of abdominal pains  Swelling of the abdomen that is new, acute  Fever of 100F or higher  For urgent or emergent issues, a gastroenterologist can be reached at any hour by calling [phone].   DIET:  We do recommend a small meal at first, but then you may proceed to your regular diet.  Drink plenty of fluids but you should avoid alcoholic beverages for 24 hours.  ACTIVITY:  You should plan to take it easy for the rest of today and you should NOT DRIVE or use heavy machinery until tomorrow (because of the sedation medicines used during the test).     FOLLOW UP: Our staff will call the number listed on your records the next business day following your procedure to check on you and address any questions or concerns that you may have regarding the information given to you following your procedure. If we do not reach you, we will leave a message.  However, if you are feeling well and you are not experiencing any problems, there is no need to return our call.  We will assume that you have returned to your regular daily activities without incident.  If any biopsies were taken you will be contacted by phone or by letter within the next 1-3 weeks.  Please call us at [phone] if you have not heard about the biopsies in 3 weeks.   Repeat next screening Colonoscopy in 10 years  SIGNATURES/CONFIDENTIALITY: You and/or your care partner have signed paperwork which will be entered into your electronic medical record.  These signatures attest to the fact that that the information above on your After Visit Summary has been reviewed and is understood.  Full responsibility of the confidentiality of this discharge information lies with you and/or your care-partner. 

## 2017-07-14 NOTE — Progress Notes (Signed)
Report given to PACU, vss 

## 2017-07-17 ENCOUNTER — Telehealth: Payer: Self-pay | Admitting: *Deleted

## 2017-07-17 NOTE — Telephone Encounter (Signed)
  Follow up Call-  Call back number 07/14/2017  Post procedure Call Back phone  # [phone]  Permission to leave phone message Yes  Some recent data might be hidden     Patient questions:  Do you have a fever, pain , or abdominal swelling? No. Pain Score  0 *  Have you tolerated food without any problems? No.  Have you been able to return to your normal activities? Yes.    Do you have any questions about your discharge instructions: Diet   No. Medications  No. Follow up visit  No.  Do you have questions or concerns about your Care? No.  Actions: * If pain score is 4 or above: No action needed, pain <4.

## 2017-08-29 DIAGNOSIS — E038 Other specified hypothyroidism: Secondary | ICD-10-CM | POA: Diagnosis not present

## 2017-08-30 DIAGNOSIS — E039 Hypothyroidism, unspecified: Secondary | ICD-10-CM | POA: Diagnosis not present

## 2017-09-05 ENCOUNTER — Other Ambulatory Visit: Payer: Self-pay | Admitting: Oncology

## 2017-09-05 ENCOUNTER — Other Ambulatory Visit: Payer: Self-pay | Admitting: Internal Medicine

## 2017-09-05 DIAGNOSIS — Z1231 Encounter for screening mammogram for malignant neoplasm of breast: Secondary | ICD-10-CM

## 2017-09-05 DIAGNOSIS — R69 Illness, unspecified: Secondary | ICD-10-CM | POA: Diagnosis not present

## 2017-09-05 DIAGNOSIS — F419 Anxiety disorder, unspecified: Secondary | ICD-10-CM | POA: Diagnosis not present

## 2017-09-05 DIAGNOSIS — Z833 Family history of diabetes mellitus: Secondary | ICD-10-CM | POA: Diagnosis not present

## 2017-09-05 DIAGNOSIS — E039 Hypothyroidism, unspecified: Secondary | ICD-10-CM | POA: Diagnosis not present

## 2017-09-05 DIAGNOSIS — Z8249 Family history of ischemic heart disease and other diseases of the circulatory system: Secondary | ICD-10-CM | POA: Diagnosis not present

## 2017-09-05 DIAGNOSIS — Z825 Family history of asthma and other chronic lower respiratory diseases: Secondary | ICD-10-CM | POA: Diagnosis not present

## 2017-09-05 DIAGNOSIS — Z853 Personal history of malignant neoplasm of breast: Secondary | ICD-10-CM | POA: Diagnosis not present

## 2017-09-05 DIAGNOSIS — Z809 Family history of malignant neoplasm, unspecified: Secondary | ICD-10-CM | POA: Diagnosis not present

## 2017-10-16 ENCOUNTER — Ambulatory Visit
Admission: RE | Admit: 2017-10-16 | Discharge: 2017-10-16 | Disposition: A | Discharge status: Home / Self Care | Payer: Medicare HMO | Source: Ambulatory Visit | Attending: Internal Medicine | Admitting: Internal Medicine

## 2017-10-16 DIAGNOSIS — Z1231 Encounter for screening mammogram for malignant neoplasm of breast: Secondary | ICD-10-CM

## 2017-10-20 DIAGNOSIS — Z6822 Body mass index (BMI) 22.0-22.9, adult: Secondary | ICD-10-CM | POA: Diagnosis not present

## 2017-10-20 DIAGNOSIS — Z01419 Encounter for gynecological examination (general) (routine) without abnormal findings: Secondary | ICD-10-CM | POA: Diagnosis not present

## 2017-10-31 DIAGNOSIS — R69 Illness, unspecified: Secondary | ICD-10-CM | POA: Diagnosis not present

## 2018-09-10 ENCOUNTER — Other Ambulatory Visit: Payer: Self-pay | Admitting: Obstetrics & Gynecology

## 2018-09-10 ENCOUNTER — Other Ambulatory Visit: Payer: Self-pay | Admitting: Internal Medicine

## 2018-09-10 DIAGNOSIS — Z1231 Encounter for screening mammogram for malignant neoplasm of breast: Secondary | ICD-10-CM

## 2018-10-18 ENCOUNTER — Ambulatory Visit
Admission: RE | Admit: 2018-10-18 | Discharge: 2018-10-18 | Disposition: A | Discharge status: Home / Self Care | Payer: Medicare Other | Source: Ambulatory Visit | Attending: Obstetrics & Gynecology | Admitting: Obstetrics & Gynecology

## 2018-10-18 ENCOUNTER — Other Ambulatory Visit: Payer: Self-pay

## 2018-10-18 DIAGNOSIS — Z1231 Encounter for screening mammogram for malignant neoplasm of breast: Secondary | ICD-10-CM

## 2019-08-22 ENCOUNTER — Telehealth: Payer: Self-pay

## 2019-08-22 NOTE — Telephone Encounter (Signed)
Type Date User Summary Attachment  General 08/22/2019 8:28 AM Timothy Lasso, RN Auto: Referral message -  Note   ----- Message ----- From: Timothy Lasso, RN Sent: 08/22/2019   8:24 AM EDT To: Hoy Register  You can schedule her with an app.  ----- Message ----- From: Hoy Register Sent: 08/22/2019   8:22 AM EDT To: Timothy Lasso, RN  Good morning, Hemi Chacko.  This is an urgent referral for rectal bleeding, N/V and gastroenteritis.  Pt last saw Dr. Ardis Hughs in 2019.

## 2019-08-22 NOTE — Telephone Encounter (Signed)
Appt made for 08/23/19 at 9 am with [facility] PA Left message on machine to call back

## 2019-08-22 NOTE — Telephone Encounter (Signed)
The pt has been advised of the appt and will be here at 845 am.

## 2019-08-23 ENCOUNTER — Encounter: Payer: Self-pay | Admitting: Physician Assistant

## 2019-08-23 ENCOUNTER — Ambulatory Visit: Payer: Medicare Other | Admitting: Physician Assistant

## 2019-08-23 VITALS — BP 136/62 | HR 70 | Ht 64.0 in | Wt 131.0 lb

## 2019-08-23 DIAGNOSIS — K529 Noninfective gastroenteritis and colitis, unspecified: Secondary | ICD-10-CM

## 2019-08-23 DIAGNOSIS — R195 Other fecal abnormalities: Secondary | ICD-10-CM | POA: Diagnosis not present

## 2019-08-23 NOTE — Patient Instructions (Addendum)
If you are age 70 or older, your body mass index should be between 23-30. Your Body mass index is 22.49 kg/m. If this is out of the aforementioned range listed, please consider follow up with your Primary Care Provider.  If you are age 17 or younger, your body mass index should be between 19-25. Your Body mass index is 22.49 kg/m. If this is out of the aformentioned range listed, please consider follow up with your Primary Care Provider.   Please complete hemocult cards in mid July.  Call back to the office if any recurrent symptoms occur during the meantime.  Please ask for Dr Ardis Hughs or Amy's nurse.  Thank you for entrusting me with your care and choosing [facility].  Amy Esterwood, PA-C

## 2019-08-25 ENCOUNTER — Encounter: Payer: Self-pay | Admitting: Physician Assistant

## 2019-08-25 NOTE — Progress Notes (Signed)
Subjective:    Patient ID: Dominique Adams, female    DOB: 1950-12-07, 69 y.o.   MRN: [id number]  HPI Dominique Adams is a pleasant 69 year old African-American female, established with Dr. Ardis Hughs, who is referred today per Dr. Raul Del office with recent episode of nausea vomiting abdominal pain diarrhea and rectal bleeding. Patient has history of breast cancer stage Ia, hypothyroidism and thalassemia trait.  She underwent colonoscopy last in May 2019 which was a normal exam, no polyps and no internal hemorrhoids noted. Patient says this episode started around May 25 and initially she thought she may have had food poisoning.  She developed rather abrupt onset of nausea vomiting and diarrhea with mild abdominal pain.  She did not have any fever or chills.  She did have clamminess at onset of symptoms and says she did not feel well for at least 5 to 6 days altogether.  After the initial episode she did not have another bowel movement for some days and eventually took a dose of MiraLAX and then passed a fair amount of clotted blood with stool.  After that she had 1 another episode with a small amount of blood and has not seen any further bleeding since.  She has not had any loose stools over the past 5 days.  She is currently feeling better, able to eat and has no significant complaints of abdominal pain or cramping. She had not been on any recent antibiotics, no known infectious exposures, no new meds supplements etc.  Review of Systems Pertinent positive and negative review of systems were noted in the above HPI section.  All other review of systems was otherwise negative.  Outpatient Encounter Medications as of 08/23/2019  Medication Sig   ascorbic acid (VITAMIN C) 500 MG tablet Take 500 mg by mouth daily.   Calcium Carb-Cholecalciferol (CALCIUM-VITAMIN D3) 600-500 MG-UNIT CAPS Take 1,500 mg by mouth daily.    levothyroxine (SYNTHROID, LEVOTHROID) 50 MCG tablet Take 50 mcg by mouth daily.   Omega-3  Fatty Acids (FISH OIL ULTRA) 1400 MG CAPS Take 1 capsule by mouth daily.   PARoxetine (PAXIL) 20 MG tablet Take 20 mg by mouth every morning.   [DISCONTINUED] fish oil-omega-3 fatty acids 1000 MG capsule Take 2 g by mouth daily.   Facility-Administered Encounter Medications as of 08/23/2019  Medication   0.9 %  sodium chloride infusion   No Known Allergies Patient Active Problem List   Diagnosis Date Noted   Alpha thalassemia trait 12/26/2013   Malignant neoplasm of upper-outer quadrant of right breast in female, estrogen receptor positive ([place]) 12/25/2012   Social History   Socioeconomic History   Marital status: Married    Spouse name: Not on file   Number of children: 2   Years of education: 16   Highest education level: Not on file  Occupational History   Occupation: retired Pharmacist, hospital    Comment: 36 years  Tobacco Use   Smoking status: Never Smoker   Smokeless tobacco: Never Used  Scientific laboratory technician Use: Never used  Substance and Sexual Activity   Alcohol use: No   Drug use: No   Sexual activity: Not on file  Other Topics Concern   Not on file  Social History Narrative   Lives with husband in a 2 story home.  Has 2 sons.     Retired Statistician.     Education: college.    Social Determinants of Health   Financial Resource Strain:    Difficulty of  Paying Living Expenses:   Food Insecurity:    Worried About Charity fundraiser in the Last Year:    Arboriculturist in the Last Year:   Transportation Needs:    Film/video editor (Medical):    Lack of Transportation (Non-Medical):   Physical Activity:    Days of Exercise per Week:    Minutes of Exercise per Session:   Stress:    Feeling of Stress :   Social Connections:    Frequency of Communication with Friends and Family:    Frequency of Social Gatherings with Friends and Family:    Attends Religious Services:    Active Member of Clubs or Organizations:    Attends  Music therapist:    Marital Status:   Intimate Partner Violence:    Fear of Current or Ex-Partner:    Emotionally Abused:    Physically Abused:    Sexually Abused:     Ms. Huard family history includes Arrhythmia (age of onset: 69) in her father; Arrhythmia (age of onset: 46) in her mother; Cancer in her father and mother; Diabetes in her brother, father, and mother; Hypertension in her father and mother; Renal Disease in her brother.      Objective:    Vitals:   08/23/19 0908  BP: 136/62  Pulse: 70    Physical Exam Well-developed well-nourishedAA female in no acute distress.  Height, [id number], BMI22.49  HEENT; nontraumatic normocephalic, EOMI, PER R LA, sclera anicteric. Oropharynx;not examined Neck; supple, no JVD Cardiovascular; regular rate and rhythm with S1-S2, no murmur rub or gallop Pulmonary; Clear bilaterally Abdomen; soft, nontender, nondistended, no palpable mass or hepatosplenomegaly, bowel sounds are active Rectal;not done today, recent heme+ Skin; benign exam, no jaundice rash or appreciable lesions Extremities; no clubbing cyanosis or edema skin warm and dry Neuro/Psych; alert and oriented x4, grossly nonfocal mood and affect appropriate       Assessment & Plan:   #24 69 year old female with an acute episode of nausea vomiting diarrhea and abdominal pain, with some symptoms lasting about a week.  She did not notice any bleeding at onset of symptoms but after she had not had a bowel movement for a few days took a dose of MiraLAX and then passed a lot of clotted old blood with her stool.  Patient says she may have passed blood with the initial episode, but had not looked. Symptoms have resolved at this point. Labs with normal CBC and c-Met are reassuring, hemoglobin 13.9.  She was documented to be heme positive x2 on 08/16/2019.  I believe her symptoms were secondary to an acute infectious gastroenteritis/colitis versus more likely  segmental ischemic colitis. Not surprising that she would still have heme positive stool after this acute inflammatory episode.  2.  Colon cancer screening-up-to-date with normal colonoscopy May 2019 3.  History of early stage breast cancer 4.  Hypothyroidism  Plan; discussion today with patient regarding self-limited probable segmental ischemic colitis versus infectious colitis and natural history of resolution without long-term sequelae. Will observe for any recurrent symptoms or hematochezia. Do not plan repeat colonoscopy at this time unless she has recurrent symptoms. I have asked her to call should she develop any recurrence of abdominal pain nausea vomiting diarrhea or hematochezia.  Tieler Cournoyer S Obryan Radu PA-C 08/25/2019   Cc: Marton Redwood, MD

## 2019-08-26 NOTE — Progress Notes (Signed)
I agree with the above note, plan 

## 2019-09-02 ENCOUNTER — Telehealth: Payer: Self-pay | Admitting: Physician Assistant

## 2019-09-02 NOTE — Telephone Encounter (Signed)
Spoke with the patient. She noted a streak of red in her stool yesterday. She did not see any today. She has daily bowel movements that are firm. She does feel she empties her bowels. Denies rectal or abdominal pain. She does not take any fiber or stool softener. Admits to poor water intake. Patient states she is aware of hemorrhoids and states these are "not really a problem, not painful." Please advise.

## 2019-09-03 NOTE — Telephone Encounter (Signed)
Ok, ask her to call back if sees any further blood -  I thinks still recovering from acute colitis - if persists will need repeat Colon

## 2019-09-04 NOTE — Telephone Encounter (Signed)
Spoke with the patient. No further blood. Advised of the recommendation. She will let us know if this persists.

## 2019-09-10 ENCOUNTER — Other Ambulatory Visit: Payer: Self-pay | Admitting: Internal Medicine

## 2019-09-10 DIAGNOSIS — Z1231 Encounter for screening mammogram for malignant neoplasm of breast: Secondary | ICD-10-CM

## 2019-09-25 ENCOUNTER — Other Ambulatory Visit (INDEPENDENT_AMBULATORY_CARE_PROVIDER_SITE_OTHER): Payer: Medicare Other

## 2019-09-25 DIAGNOSIS — K529 Noninfective gastroenteritis and colitis, unspecified: Secondary | ICD-10-CM | POA: Diagnosis not present

## 2019-09-25 DIAGNOSIS — R195 Other fecal abnormalities: Secondary | ICD-10-CM

## 2019-09-25 LAB — HEMOCCULT SLIDES (X 3 CARDS)
Fecal Occult Blood: NEGATIVE
OCCULT 1: NEGATIVE
OCCULT 2: NEGATIVE
OCCULT 3: NEGATIVE
OCCULT 4: NEGATIVE
OCCULT 5: NEGATIVE

## 2019-10-21 ENCOUNTER — Other Ambulatory Visit: Payer: Self-pay

## 2019-10-21 ENCOUNTER — Ambulatory Visit
Admission: RE | Admit: 2019-10-21 | Discharge: 2019-10-21 | Disposition: A | Discharge status: Home / Self Care | Payer: Medicare Other | Source: Ambulatory Visit | Attending: Internal Medicine | Admitting: Internal Medicine

## 2019-10-21 DIAGNOSIS — Z1231 Encounter for screening mammogram for malignant neoplasm of breast: Secondary | ICD-10-CM

## 2019-11-29 ENCOUNTER — Telehealth: Payer: Self-pay | Admitting: *Deleted

## 2019-11-29 DIAGNOSIS — N952 Postmenopausal atrophic vaginitis: Secondary | ICD-10-CM

## 2019-11-29 DIAGNOSIS — Z170 Estrogen receptor positive status [ER+]: Secondary | ICD-10-CM

## 2019-11-29 NOTE — Telephone Encounter (Signed)
This RN spoke with pt per her return call regarding possible use of localized estrogen for atrophic vaginitis-   Note pt has completed therapy in this office for a ER/PR + breast cancer post 5 years of AI therapy.  This RN discussed concern with use of localized estrogen cream and possible absorption systemically per this office and her history of ER/PR + breast cancer.  This RN discussed her current issues with atrophic vaginitis including referral to the [facility] PT.  Dominique Adams stated she would like to proceed with above referral.  This RN did advise pt to call her insurance carrier to verify her benefits for PT.  Referral placed.

## 2020-01-15 ENCOUNTER — Encounter: Payer: Self-pay | Admitting: Physical Therapy

## 2020-01-15 ENCOUNTER — Ambulatory Visit: Payer: Medicare Other | Attending: Oncology | Admitting: Physical Therapy

## 2020-01-15 ENCOUNTER — Other Ambulatory Visit: Payer: Self-pay

## 2020-01-15 DIAGNOSIS — N941 Unspecified dyspareunia: Secondary | ICD-10-CM

## 2020-01-15 DIAGNOSIS — M6281 Muscle weakness (generalized): Secondary | ICD-10-CM | POA: Diagnosis present

## 2020-01-15 DIAGNOSIS — R278 Other lack of coordination: Secondary | ICD-10-CM

## 2020-01-15 NOTE — Therapy (Signed)
[facility] [address] [place], [place], [address] Phone: [phone]   Fax:  [phone]  Physical Therapy Evaluation  Patient Details  Name: Dominique Adams MRN: [id number] Date of Birth: Mar 16, 1950 Referring Provider (PT): Dr. Lurline Del   Encounter Date: 01/15/2020   PT End of Session - 01/15/20 1132    Visit Number 1    Date for PT Re-Evaluation 03/11/20    Authorization Type [employer] medicare    PT Start Time 1100    PT Stop Time 1145    PT Time Calculation (min) 45 min    Activity Tolerance Patient tolerated treatment well;No increased pain    Behavior During Therapy WFL for tasks assessed/performed           Past Medical History:  Diagnosis Date  . Anxiety   . Arthritis   . Blood transfusion    no surgery  . Breast cancer ([place]) 10/04/11   right breast=invasive mammary ca,ductal ca in situ w/calcifications,ER/PR=Positive  . Cataract   . Dysrhythmia    used to see dr tennant-he put her on meds-released her 2011  . History of radiation therapy 12/06/11-01/02/12   right breast,4250cGy/17sessions,boost=750cGy/3sesions  . Personal history of radiation therapy   . Thyroid disease   . Wears glasses     Past Surgical History:  Procedure Laterality Date  . BREAST LUMPECTOMY Right 2013  . COLONOSCOPY  2009   neg  . CYSTECTOMY  1995   lt ankle  . right breast bx  10/04/11   righy uoq bx=invasive mammary ca,dcis w calcifications  . right breast lumpectomy  10/28/11   right,invasive ductal ca,dcis,margins not involved,1 benign lympgh node axilla    There were no vitals filed for this visit.    Subjective Assessment - 01/15/20 1109    Subjective Patient is suffering from vaginal dryness. She has had breast cancer that is Estrogen based. The dryness is getting worse. Intercourse is difficult due to rawness. Patient uses lubricant.    Patient Stated Goals education on vaginal health, reduce pain  with intercourse    Currently in Pain? Yes    Pain Score 7     Pain Location Vagina    Pain Orientation Mid    Pain Descriptors / Indicators Sore    Pain Type Chronic pain    Pain Onset More than a month ago    Pain Frequency Intermittent    Aggravating Factors  penile penetration, vaginal exam    Pain Relieving Factors no vaginal penetration    Multiple Pain Sites No              [facility] PT Assessment - 01/15/20 0001      Assessment   Medical Diagnosis N95.2 Postmenopausal atrophic vaginaitis; C50.411, Z17.0 Malignant neoplasm of upper-outer quadrant of right breast in female, estrogen receptor    Referring Provider (PT) Dr. Sarajane Jews Magrinat    Onset Date/Surgical Date 10/04/11    Prior Therapy None      Precautions   Precautions Other (comment)    Precaution Comments breast cancer estrogen positive      Restrictions   Weight Bearing Restrictions No      Balance Screen   Has the patient fallen in the past 6 months No    Has the patient had a decrease in activity level because of a fear of falling?  No    Is the patient reluctant to leave their home because of a fear of falling?  No  [place] residence      Prior Function   Level of [place] Retired    Leisure quilting, Scientist, physiological, Pharmacist, community   Overall Cognitive Status Within Functional Limits for tasks assessed      Posture/Postural Control   Posture/Postural Control No significant limitations      ROM / Strength   AROM / PROM / Strength AROM;PROM;Strength      Strength   Overall Strength Comments when contract her abdominals will bulge the lower abdominals    Right Hip ABduction 4/5    Left Hip ABduction 4/5                      Objective measurements completed on examination: See above findings.     Pelvic Floor Special Questions - 01/15/20 0001    Prior Pregnancies Yes    Number of Vaginal Deliveries 2      Episiotomy Performed Yes   2   Currently Sexually Active Yes    Is this Painful Yes   painful to urinate after intercourse   Marinoff Scale pain interrupts completion    Urinary Leakage No    Fecal incontinence No   sometimes has constipation   External Perineal Exam white area located at the loeft post. forchette and last 1/3 of the inner labia majora    Skin Integrity Intact   dry   Perineal Body/Introitus  Other   tightness   External Palpation tenderness on the right  side of the perineal body    Prolapse Anterior Wall   just above the introitus   Pelvic Floor Internal Exam Patient confirmed identification and approves PT to assess pelvid floor and treatment    Exam Type Vaginal    Palpation No movement of the urethra sphincter; tenderness located in the posterior introitus, levator ani    Strength weak squeeze, no lift   unable to bulge the pelvic floor   Tone increased                    PT Education - 01/15/20 1403    Education Details educated patient on vaginal moisturizers for internal and external; educated patient on the anatomy of the vaginal and pelvis    Person(s) Educated Patient    Methods Explanation;Demonstration;Handout    Comprehension Verbalized understanding            PT Short Term Goals - 01/15/20 1414      PT SHORT TERM GOAL #1   Title independent with initial HEP for stretches and bulging of the pelvic floor    Time 4    Period Weeks    Status New    Target Date 02/12/20      PT SHORT TERM GOAL #2   Title education on vaginal moisturizers and lubricants for improved vaginal moisture and less friction with intercourse    Time 4    Period Weeks    Status New    Target Date 02/12/20             PT Long Term Goals - 01/15/20 1416      PT LONG TERM GOAL #1   Title independent with advanced HEP for pelvic floor elongation and strengthening    Time 8    Period Weeks    Status New    Target Date 03/11/20      PT LONG TERM GOAL  #  2   Title pain with penile penetration </= 75% due to improved vaginal health and relaxation of the pelvic floor    Time 8    Period Weeks    Status New    Target Date 03/11/20      PT LONG TERM GOAL #3   Title vaginal dryness decreased >/= 75% due to using the proper creams to improve the moisture    Time 8    Period Weeks    Status New    Target Date 03/11/20      PT LONG TERM GOAL #4   Title urination after penile penetration has buring </= 75% due to improve health of the tissue    Time 8    Period Weeks    Status New    Target Date 03/11/20                  Plan - 01/15/20 1406    Clinical Impression Statement Patient is a 69 year old female with vaginal dryness and pain with intercourse. She has a history of right breast cancer estrogen receptor, 10/04/2011,  with radiation. Patient reports pain with penile penetration at level 7/10 and burning with urination after intercourse. Patient has vaginal discomfort when she wipes. Pelvic strength is 2/5 and is increased tone. She is unable to contract the urethra sphincter. She has pain at the posterior introitus, bilateral levator ani. She has a white area at the left posterior introitus and left inner labia majora. Patient anterior vaginal wall is just before the introitus. Patient is not able to bulge the pelvic floor. She reports no leakage. Patient reports she had the [facility] treatment with minimal results. Patient will bulge her lower abdomin when asked to contract her abdominal muscles. Patient will benefit from skilled therapy to improve tissue mobility and elongation for penile penetration and strength of the pelvic floor for coordination.    Personal Factors and Comorbidities Age;Fitness;Comorbidity 2;Sex    Comorbidities Right breasat cancer estrogen receptor positive 10/04/2011; radiation treatment 12/06/2011 to 01/02/2012    Examination-Participation Restrictions Interpersonal Relationship;Community Activity     Stability/Clinical Decision Making Evolving/Moderate complexity    Clinical Decision Making Low    Rehab Potential Good    PT Frequency 1x / week    PT Duration 8 weeks    PT Treatment/Interventions ADLs/Self Care Home Management;Biofeedback;Cryotherapy;Electrical Stimulation;Moist Heat;Neuromuscular re-education;Therapeutic exercise;Therapeutic activities;Patient/family education;Manual techniques;Dry needling    PT Next Visit Plan see if the creams are helping with moisture; hip stretches; bulging of the pelvic floor, press and release to work on the blood flow; diaphragmatic breathing    Consulted and Agree with Plan of Care Patient           Patient will benefit from skilled therapeutic intervention in order to improve the following deficits and impairments:  Decreased coordination, Increased fascial restricitons, Pain, Increased muscle spasms, Decreased endurance, Decreased activity tolerance, Decreased strength, Decreased mobility  Visit Diagnosis: Muscle weakness (generalized) - Plan: PT plan of care cert/re-cert  Other lack of coordination - Plan: PT plan of care cert/re-cert  Dyspareunia, female - Plan: PT plan of care cert/re-cert     Problem List Patient Active Problem List   Diagnosis Date Noted  . Alpha thalassemia trait 12/26/2013  . Malignant neoplasm of upper-outer quadrant of right breast in female, estrogen receptor positive ([place]) 12/25/2012    Earlie Counts, PT 01/15/20 2:21 PM   [facility] [address] [place],  [place], [address] Phone: [phone]   Fax:  [phone]  Name: Dominique Adams MRN: [id number] Date of Birth: [birth year]

## 2020-01-15 NOTE — Patient Instructions (Addendum)
Moisturizers . They are used in the vagina to hydrate the mucous membrane that make up the vaginal canal. . Designed to keep a more normal acid balance (ph) . Once placed in the vagina, it will last between two to three days.  . Use 2-3 times per week at bedtime  . Ingredients to avoid is glycerin and fragrance, can increase chance of infection . Should not be used just before sex due to causing irritation . Most are gels administered either in a tampon-shaped applicator or as a vaginal suppository. They are non-hormonal.   Types of Moisturizers(internal use)  . Vitamin E vaginal suppositories- Whole foods, Amazon . Moist Again . Coconut oil- can break down condoms . Julva- (Do no use if on Tamoxifen) amazon . Yes moisturizer- amazon . NeuEve Silk , NeuEve Silver for menopausal or over 65 (if have severe vaginal atrophy or cancer treatments use NeuEve Silk for  1 month than move to [employer])- [employer], MapleFlower.dk . Olive and Bee intimate cream- www.oliveandbee.com.au . Mae vaginal [place] . Aloe . Good clean love   Creams to use externally on the Vulva area  Albertson's (good for for cancer patients that had radiation to the area)- [place] or [employer].FlyingBasics.com.br  V-magic cream - amazon  Julva-amazon  Vital "V Wild Yam salve ( help moisturize and help with thinning vulvar area, does have McFall by Irwin Brakeman labial moisturizer ([place],   Coconut or olive oil  Aloe  Good clean love   Things to avoid in the vaginal area . Do not use things to irritate the vulvar area . No lotions just specialized creams for the vulva area- Neogyn, V-magic, No soaps; can use Aveeno or Calendula cleanser if needed. Must be gentle . No deodorants . No douches . Good to sleep without underwear to let the vaginal area to air out . No scrubbing: spread the lips to let warm water rinse  over labias and pat dry

## 2020-01-22 ENCOUNTER — Ambulatory Visit: Payer: Medicare Other | Admitting: Physical Therapy

## 2020-01-22 ENCOUNTER — Other Ambulatory Visit: Payer: Self-pay

## 2020-01-22 ENCOUNTER — Encounter: Payer: Self-pay | Admitting: Physical Therapy

## 2020-01-22 DIAGNOSIS — N941 Unspecified dyspareunia: Secondary | ICD-10-CM

## 2020-01-22 DIAGNOSIS — R278 Other lack of coordination: Secondary | ICD-10-CM

## 2020-01-22 DIAGNOSIS — M6281 Muscle weakness (generalized): Secondary | ICD-10-CM | POA: Diagnosis not present

## 2020-01-22 NOTE — Therapy (Signed)
[facility] [address], [place], [place], [address] Phone: [phone]   Fax:  [phone]  Physical Therapy Treatment  Patient Details  Name: Dominique Adams MRN: [id number] Date of Birth: 30-Nov-1950 Referring Provider (PT): Dr. Lurline Del   Encounter Date: 01/22/2020   PT End of Session - 01/22/20 1317    Visit Number 2    Date for PT Re-Evaluation 03/11/20    Authorization Type [place] - Visit Number 2    Authorization - Number of Visits 10    PT Start Time 7654    PT Stop Time 1310    PT Time Calculation (min) 40 min    Activity Tolerance Patient tolerated treatment well;No increased pain    Behavior During Therapy WFL for tasks assessed/performed           Past Medical History:  Diagnosis Date  . Anxiety   . Arthritis   . Blood transfusion    no surgery  . Breast cancer ([place]) 10/04/11   right breast=invasive mammary ca,ductal ca in situ w/calcifications,ER/PR=Positive  . Cataract   . Dysrhythmia    used to see dr tennant-he put her on meds-released her 2011  . History of radiation therapy 12/06/11-01/02/12   right breast,4250cGy/17sessions,boost=750cGy/3sesions  . Personal history of radiation therapy   . Thyroid disease   . Wears glasses     Past Surgical History:  Procedure Laterality Date  . BREAST LUMPECTOMY Right 2013  . COLONOSCOPY  2009   neg  . CYSTECTOMY  1995   lt ankle  . right breast bx  10/04/11   righy uoq bx=invasive mammary ca,dcis w calcifications  . right breast lumpectomy  10/28/11   right,invasive ductal ca,dcis,margins not involved,1 benign lympgh node axilla    There were no vitals filed for this visit.   Subjective Assessment - 01/22/20 1236    Subjective I am excited to get relief. I ordered the V-cream and vitamin E suppositories.    Patient Stated Goals education on vaginal health, reduce pain with intercourse    Currently in  Pain? Yes    Pain Score 7     Pain Location Vagina    Pain Orientation Mid    Pain Descriptors / Indicators Sore    Pain Type Chronic pain    Pain Frequency Intermittent    Aggravating Factors  penile penetration, vaginal exam    Pain Relieving Factors no vaginal penetration    Multiple Pain Sites No                             [facility] Adult PT Treatment/Exercise - 01/22/20 0001      Self-Care   Self-Care Other Self-Care Comments    Other Self-Care Comments  instructed patient where to place the v-magic cream on the vulva area and to place the vitamin E cream into the vaginal canal; educated patient on press and release of the vulvar and perineal area to improve blood flow      Neuro Re-ed    Neuro Re-ed Details  diaphragmatic breathing with tactile cues to relax the pelvic floor      Lumbar Exercises: Stretches   Active Hamstring Stretch Right;Left;1 rep;30 seconds    Active Hamstring Stretch Limitations supine with a strap    Quadruped Mid Back Stretch 1 rep;30 seconds    Quadruped Mid Back Stretch Limitations childs pose    ITB  Stretch Right;Left;1 rep;30 seconds    ITB Stretch Limitations supine with strap    Piriformis Stretch Right;Left;1 rep;30 seconds    Piriformis Stretch Limitations sitting    Other Lumbar Stretch Exercise hip adductor stretch with strap hold 30 sec, right, left    Other Lumbar Stretch Exercise happy baby stretch      Manual Therapy   Manual Therapy Soft tissue mobilization    Soft tissue mobilization using the prickly ball roller to relax the hip adductors; gentle soft tissue work to the ischiocabernosus, hip adductor insertion, and perineal body through the clothes                  PT Education - 01/22/20 1315    Education Details where to place the v-magic cream to the vulva area and vitamin E suppositories into the vagial canal and press and release of the vulva;Access Code: [email or web address]    Person(s) Educated Patient     Methods Explanation;Demonstration;Handout    Comprehension Verbalized understanding;Returned demonstration            PT Short Term Goals - 01/15/20 1414      PT SHORT TERM GOAL #1   Title independent with initial HEP for stretches and bulging of the pelvic floor    Time 4    Period Weeks    Status New    Target Date 02/12/20      PT SHORT TERM GOAL #2   Title education on vaginal moisturizers and lubricants for improved vaginal moisture and less friction with intercourse    Time 4    Period Weeks    Status New    Target Date 02/12/20             PT Long Term Goals - 01/15/20 1416      PT LONG TERM GOAL #1   Title independent with advanced HEP for pelvic floor elongation and strengthening    Time 8    Period Weeks    Status New    Target Date 03/11/20      PT LONG TERM GOAL #2   Title pain with penile penetration </= 75% due to improved vaginal health and relaxation of the pelvic floor    Time 8    Period Weeks    Status New    Target Date 03/11/20      PT LONG TERM GOAL #3   Title vaginal dryness decreased >/= 75% due to using the proper creams to improve the moisture    Time 8    Period Weeks    Status New    Target Date 03/11/20      PT LONG TERM GOAL #4   Title urination after penile penetration has buring </= 75% due to improve health of the tissue    Time 8    Period Weeks    Status New    Target Date 03/11/20                 Plan - 01/22/20 1318    Clinical Impression Statement Patient has gotten her V-magic cream and Vitamin E suppositories to improve vaginal health. Patient has learned stretches for the hips to elongate the muscles. Patient understands how to moisturize the area. Patient is excited about the therapy. Patient still needs cues to bulge her pelvic floor. Patient will benefit from skilled therapy to improve tissue mobility and elongation for penile penetration and strength of the pelvic floor for coordination.    Personal  Factors and Comorbidities Age;Fitness;Comorbidity 2;Sex    Comorbidities Right breasat cancer estrogen receptor positive 10/04/2011; radiation treatment 12/06/2011 to 01/02/2012    Examination-Participation Restrictions Interpersonal Relationship;Community Activity    Stability/Clinical Decision Making Evolving/Moderate complexity    Rehab Potential Good    PT Frequency 1x / week    PT Duration 8 weeks    PT Treatment/Interventions ADLs/Self Care Home Management;Biofeedback;Cryotherapy;Electrical Stimulation;Moist Heat;Neuromuscular re-education;Therapeutic exercise;Therapeutic activities;Patient/family education;Manual techniques;Dry needling    PT Next Visit Plan continues to work on bulging the pelvic floor, manual work on the vulvar area to improve length of tissue, work on the perineal body    PT Home Exercise Plan Access Code: [email or web address]    Recommended Other Services MD signed initial eval    Consulted and Agree with Plan of Care Patient           Patient will benefit from skilled therapeutic intervention in order to improve the following deficits and impairments:  Decreased coordination, Increased fascial restricitons, Pain, Increased muscle spasms, Decreased endurance, Decreased activity tolerance, Decreased strength, Decreased mobility  Visit Diagnosis: Muscle weakness (generalized)  Other lack of coordination  Dyspareunia, female     Problem List Patient Active Problem List   Diagnosis Date Noted  . Alpha thalassemia trait 12/26/2013  . Malignant neoplasm of upper-outer quadrant of right breast in female, estrogen receptor positive ([place]) 12/25/2012    Earlie Counts, PT 01/22/20 2:04 PM   [facility] [address], [place], [place], [address] Phone: [phone]   Fax:  [phone]  Name: Dominique Adams MRN: [id number] Date of Birth: 07/05/1950

## 2020-01-22 NOTE — Patient Instructions (Signed)
Access Code: [email or web address] URL: [email or web address] Date: 01/22/2020 Prepared by: Earlie Counts  Exercises Seated Piriformis Stretch with Trunk Bend - 1 x daily - 7 x weekly - 1 sets - 2 reps - 30 sec hold Supine Hamstring Stretch with Strap - 1 x daily - 7 x weekly - 1 sets - 2 reps - 30 sec hold Supine ITB Stretch with Strap - 1 x daily - 7 x weekly - 1 sets - 2 reps - 30 sec hold Hip Adductors and Hamstring Stretch with Strap - 1 x daily - 7 x weekly - 1 sets - 2 reps - 30 se hold Supine Pelvic Floor Stretch - 1 x daily - 7 x weekly - 1 sets - 1 reps - 1 min hold Child's Pose Stretch - 1 x daily - 7 x weekly - 1 sets - 2 reps - 30 sec hold [facility] [address], [place], [place] [address] Phone # [phone] Fax [phone]

## 2020-01-27 ENCOUNTER — Other Ambulatory Visit: Payer: Self-pay

## 2020-01-27 ENCOUNTER — Encounter: Payer: Self-pay | Admitting: Physical Therapy

## 2020-01-27 ENCOUNTER — Ambulatory Visit: Payer: Medicare Other | Admitting: Physical Therapy

## 2020-01-27 DIAGNOSIS — M6281 Muscle weakness (generalized): Secondary | ICD-10-CM

## 2020-01-27 DIAGNOSIS — R278 Other lack of coordination: Secondary | ICD-10-CM

## 2020-01-27 DIAGNOSIS — N941 Unspecified dyspareunia: Secondary | ICD-10-CM

## 2020-01-27 NOTE — Therapy (Signed)
[facility] [address], [place], [place], [address] Phone: [phone]   Fax:  [phone]  Physical Therapy Treatment  Patient Details  Name: Dominique Adams MRN: [id number] Date of Birth: 1950/05/19 Referring Provider (PT): Dr. Lurline Del   Encounter Date: 01/27/2020   PT End of Session - 01/27/20 1027    Visit Number 3    Date for PT Re-Evaluation 03/11/20    Authorization Type [place] - Visit Number 3    Authorization - Number of Visits 10    PT Start Time 4401    PT Stop Time 1100    PT Time Calculation (min) 38 min    Activity Tolerance Patient tolerated treatment well;No increased pain    Behavior During Therapy WFL for tasks assessed/performed           Past Medical History:  Diagnosis Date  . Anxiety   . Arthritis   . Blood transfusion    no surgery  . Breast cancer ([place]) 10/04/11   right breast=invasive mammary ca,ductal ca in situ w/calcifications,ER/PR=Positive  . Cataract   . Dysrhythmia    used to see dr tennant-he put her on meds-released her 2011  . History of radiation therapy 12/06/11-01/02/12   right breast,4250cGy/17sessions,boost=750cGy/3sesions  . Personal history of radiation therapy   . Thyroid disease   . Wears glasses     Past Surgical History:  Procedure Laterality Date  . BREAST LUMPECTOMY Right 2013  . COLONOSCOPY  2009   neg  . CYSTECTOMY  1995   lt ankle  . right breast bx  10/04/11   righy uoq bx=invasive mammary ca,dcis w calcifications  . right breast lumpectomy  10/28/11   right,invasive ductal ca,dcis,margins not involved,1 benign lympgh node axilla    There were no vitals filed for this visit.   Subjective Assessment - 01/27/20 1024    Subjective I have not had time to do my exercises. I feel better with using the vitamien E suppositiory and v-magic cream to the vulva area.    Patient Stated Goals education on vaginal  health, reduce pain with intercourse    Currently in Pain? Yes    Pain Score 7     Pain Location Vagina    Pain Orientation Mid    Pain Descriptors / Indicators Sore    Pain Type Chronic pain    Pain Onset More than a month ago    Pain Frequency Intermittent    Aggravating Factors  penile penetration, vaginal    Pain Relieving Factors no vaginal penetration                          Pelvic Floor Special Questions - 01/27/20 0001    Pelvic Floor Internal Exam Patient confirmed identification and approves PT to assess pelvid floor and treatment    Exam Type Vaginal             [facility] Adult PT Treatment/Exercise - 01/27/20 0001      Manual Therapy   Manual Therapy Soft tissue mobilization;Internal Pelvic Floor    Soft tissue mobilization using the prickly ball roller to relax the hip adductors; gentle soft tissue work to the ischiocabernosus, hip adductor insertion, and perineal body    Internal Pelvic Floor gentle work to the post. introitus, along the levator ani while monitoring for pain  PT Short Term Goals - 01/27/20 1027      PT SHORT TERM GOAL #1   Title independent with initial HEP for stretches and bulging of the pelvic floor    Time 4    Period Weeks    Status On-going      PT SHORT TERM GOAL #2   Title education on vaginal moisturizers and lubricants for improved vaginal moisture and less friction with intercourse    Time 4    Period Weeks    Status Achieved             PT Long Term Goals - 01/15/20 1416      PT LONG TERM GOAL #1   Title independent with advanced HEP for pelvic floor elongation and strengthening    Time 8    Period Weeks    Status New    Target Date 03/11/20      PT LONG TERM GOAL #2   Title pain with penile penetration </= 75% due to improved vaginal health and relaxation of the pelvic floor    Time 8    Period Weeks    Status New    Target Date 03/11/20      PT LONG TERM GOAL #3    Title vaginal dryness decreased >/= 75% due to using the proper creams to improve the moisture    Time 8    Period Weeks    Status New    Target Date 03/11/20      PT LONG TERM GOAL #4   Title urination after penile penetration has buring </= 75% due to improve health of the tissue    Time 8    Period Weeks    Status New    Target Date 03/11/20                 Plan - 01/27/20 1028    Clinical Impression Statement Patient is using the vitamin e suppository internally and V-magic to the vulvar area and has reduce the dryness and make it easier to perform internally work. Patient has not had a chance to do her initial exercises due to being busy with the holidays. Patient will benefit from skilled therapy to improve tissue mobility and elongation for penile penetration and strength fo the pelvic floor for coordination.    Personal Factors and Comorbidities Age;Fitness;Comorbidity 2;Sex    Comorbidities Right breasat cancer estrogen receptor positive 10/04/2011; radiation treatment 12/06/2011 to 01/02/2012    Examination-Participation Restrictions Interpersonal Relationship;Community Activity    Stability/Clinical Decision Making Evolving/Moderate complexity    Rehab Potential Good    PT Frequency 1x / week    PT Duration 8 weeks    PT Treatment/Interventions ADLs/Self Care Home Management;Biofeedback;Cryotherapy;Electrical Stimulation;Moist Heat;Neuromuscular re-education;Therapeutic exercise;Therapeutic activities;Patient/family education;Manual techniques;Dry needling    PT Next Visit Plan educated on manual work internally and using her vibrator for increased flow; bulging of the pelvic floor    PT Home Exercise Plan Access Code: [email or web address]    Consulted and Agree with Plan of Care Patient           Patient will benefit from skilled therapeutic intervention in order to improve the following deficits and impairments:  Decreased coordination, Increased fascial restricitons, Pain,  Increased muscle spasms, Decreased endurance, Decreased activity tolerance, Decreased strength, Decreased mobility  Visit Diagnosis: Muscle weakness (generalized)  Other lack of coordination  Dyspareunia, female     Problem List Patient Active Problem List   Diagnosis Date Noted  . Alpha thalassemia  trait 12/26/2013  . Malignant neoplasm of upper-outer quadrant of right breast in female, estrogen receptor positive ([place]) 12/25/2012   Earlie Counts, PT 01/27/20 11:01 AM   [facility] [address] [place], [place], [address] Phone: [phone]   Fax:  [phone]  Name: Salina Stanfield MRN: [id number] Date of Birth: 1950-08-05

## 2020-02-03 ENCOUNTER — Encounter: Payer: Medicare Other | Admitting: Physical Therapy

## 2020-02-27 ENCOUNTER — Other Ambulatory Visit: Payer: Self-pay

## 2020-02-27 ENCOUNTER — Encounter: Payer: Self-pay | Admitting: Physical Therapy

## 2020-02-27 ENCOUNTER — Ambulatory Visit: Payer: Medicare Other | Attending: Oncology | Admitting: Physical Therapy

## 2020-02-27 DIAGNOSIS — M6281 Muscle weakness (generalized): Secondary | ICD-10-CM | POA: Diagnosis present

## 2020-02-27 DIAGNOSIS — R278 Other lack of coordination: Secondary | ICD-10-CM

## 2020-02-27 DIAGNOSIS — N941 Unspecified dyspareunia: Secondary | ICD-10-CM | POA: Diagnosis present

## 2020-02-27 NOTE — Patient Instructions (Signed)
STRETCHING THE PELVIC FLOOR MUSCLES NO DILATOR  Supplies . Vaginal lubricant . Mirror (optional) . Gloves (optional) or clean hands Positioning . Start in a semi-reclined position with your head propped up. Bend your knees and place your thumb or finger at the vaginal opening. Procedure . Apply a moderate amount of lubricant on the outer skin of your vagina, the labia minora.  Apply additional lubricant to your finger. [place] Kitchen Spread the skin away from the vaginal opening. Place the end of your finger at the opening. . Do a maximum contraction of the pelvic floor muscles. Tighten the vagina and the anus maximally and relax. . When you know they are relaxed, gently and slowly insert your finger into your vagina, directing your finger slightly downward, for 2-3 inches of insertion. . Relax and stretch the 6 o'clock position . Hold each stretch for _30-60 seconds, no pain more than 3/10 . Repeat the stretching in the 4 o'clock and 8 o'clock positions. . Next gently move your finger in a "U" shape  several times.  . You can also enter a second finger to work to spread the vaginal opening wider from 3:00-6:00 and 6:00-9:00 or 3:00-9:00 . Perform daily or every other day . Once you have accomplished the techniques you may try them in standing with one foot resting on the tub, or in other positions.  This is a good stretch to do in the shower if you don't need to use lubricant.   Use the vibrator around the vulvar area and perineal body do for 4 minutes every other day.  After intercourse use the [facility] Reveleum on the area that burns  Lubrication . Used for intercourse to reduce friction . Avoid ones that have glycerin, warming gels, tingling gels, icing or cooling gel, scented . Avoid parabens due to a preservative similar to female sex hormone . May need to be reapplied once or several times during sexual activity . Can be applied to both partners genitals prior to vaginal penetration to  minimize friction or irritation . Prevent irritation and mucosal tears that cause post coital pain and increased the risk of vaginal and urinary tract infections . Oil-based lubricants cannot be used with condoms due to breaking them down.  Least likely to irritate vaginal tissue.  . Plant based-lubes are safe . Silicone-based lubrication are thicker and last long and used for post-menopausal women  Vaginal Lubricators Here is a list of some suggested lubricators you can use for intercourse. Use the most hypoallergenic product.  You can place on you or your partner.   Slippery Stuff ( water based)  Sylk or Sliquid Natural H2O ( good  if frequent UTI's)- walmart, amazon  Sliquid organics silk-(aloe and silicone based )  [employer] (www.blossom-organics.com)- (aloe based )  Coconut oil, olive oil -not good with condoms   PJur Woman Nude- (water based) amazon  Uberlube- ( silicon) [place] has an organic one  Yes lubricant- (water based and has plant oil based similar to silicone) [employer] Platinum-Silicone, Target, Walgreens  Olive and Bee intimate cream-  www.oliveandbee.com.au  [place] Things to avoid in lubricants are glycerin, warming gels, tingling gels, icing or cooling  gels, and scented gels.  Also avoid Vaseline. KY jelly, Replens, and Astroglide kills good bacteria(lactobacilli)  Things to avoid in the vaginal area . Do not use things to irritate the vulvar area . No lotions- see below .  No soaps; can use Aveeno or Calendula cleanser if needed. Must be gentle . No deodorants . No douches . Good to sleep without underwear to let the vaginal area to air out . No scrubbing: spread the lips to let warm water rinse over labias and pat dry  Creams that can be used on the Vulva Area  V [employer], walmart  Vital V Wild Yam Salve  Julva- [employer] Botanical Pro-Meno Wild Yam  Cream  Coconut oil, olive oil  Cleo by [employer] labial moisturizer -Amazon,   [place] Releveum ( lidocaine) or [place] [employer]

## 2020-02-27 NOTE — Therapy (Signed)
[facility] [address], [place], [place], [address] Phone: [phone]   Fax:  [phone]  Physical Therapy Treatment  Patient Details  Name: Dominique Adams MRN: [id number] Date of Birth: 03/18/1950 Referring Provider (PT): Dr. Lurline Del   Encounter Date: 02/27/2020   PT End of Session - 02/27/20 1539    Visit Number 4    Date for PT Re-Evaluation 03/11/20    Authorization Type [place] - Visit Number 4    Authorization - Number of Visits 10    PT Start Time [id number]    PT Stop Time 1610    PT Time Calculation (min) 40 min    Activity Tolerance Patient tolerated treatment well;No increased pain    Behavior During Therapy WFL for tasks assessed/performed           Past Medical History:  Diagnosis Date  . Anxiety   . Arthritis   . Blood transfusion    no surgery  . Breast cancer ([place]) 10/04/11   right breast=invasive mammary ca,ductal ca in situ w/calcifications,ER/PR=Positive  . Cataract   . Dysrhythmia    used to see dr tennant-he put her on meds-released her 2011  . History of radiation therapy 12/06/11-01/02/12   right breast,4250cGy/17sessions,boost=750cGy/3sesions  . Personal history of radiation therapy   . Thyroid disease   . Wears glasses     Past Surgical History:  Procedure Laterality Date  . BREAST LUMPECTOMY Right 2013  . COLONOSCOPY  2009   neg  . CYSTECTOMY  1995   lt ankle  . right breast bx  10/04/11   righy uoq bx=invasive mammary ca,dcis w calcifications  . right breast lumpectomy  10/28/11   right,invasive ductal ca,dcis,margins not involved,1 benign lympgh node axilla    There were no vitals filed for this visit.   Subjective Assessment - 02/27/20 1534    Subjective I tried the Good Clean love and it did not work. I am using the suppositories and v-magic which help.I feel the pain after intercourse outside the vagina. During intercourse her pain  is 40% better.    Patient Stated Goals education on vaginal health, reduce pain with intercourse    Currently in Pain? Yes    Pain Score 7     Pain Location Vagina    Pain Orientation Mid    Pain Descriptors / Indicators Sore    Pain Type Chronic pain    Pain Onset More than a month ago    Pain Frequency Intermittent    Aggravating Factors  penile penetration, vaginal    Pain Relieving Factors no vaginal penetration                          Pelvic Floor Special Questions - 02/27/20 0001    Pelvic Floor Internal Exam Patient confirmed identification and approves PT to assess pelvid floor and treatment    Exam Type Vaginal    Palpation tightness in the perineal body, puborectalis, and no movement of the coccyx    Strength weak squeeze, no lift   unable to bulge the pelvic floor            [facility] Adult PT Treatment/Exercise - 02/27/20 0001      Self-Care   Self-Care Other Self-Care Comments    Other Self-Care Comments  education o nsilicone lubricants to reduce the friction, using the Automatic Data on the perinal body after intercourse,  using the vaginal vibrator on the perineal body and vulvar area to increase blood flow.      Manual Therapy   Manual Therapy Internal Pelvic Floor    Manual therapy comments educated patient on pelvic floor massage    Internal Pelvic Floor manual work to the perineal body, along the anal sphincter, puborectlais, and mobilization of the coccyx with one finger internally and other externally   left sidely                 PT Education - 02/27/20 1553    Education Details education on lubricants that have silicone; using her vibrator on the vulvar and perineal area;    Person(s) Educated Patient    Methods Explanation    Comprehension Verbalized understanding            PT Short Term Goals - 01/27/20 1027      PT SHORT TERM GOAL #1   Title independent with initial HEP for stretches and bulging of the pelvic  floor    Time 4    Period Weeks    Status On-going      PT SHORT TERM GOAL #2   Title education on vaginal moisturizers and lubricants for improved vaginal moisture and less friction with intercourse    Time 4    Period Weeks    Status Achieved             PT Long Term Goals - 02/27/20 1537      PT LONG TERM GOAL #1   Title independent with advanced HEP for pelvic floor elongation and strengthening    Time 8    Period Weeks    Status On-going      PT LONG TERM GOAL #2   Title pain with penile penetration </= 75% due to improved vaginal health and relaxation of the pelvic floor    Baseline 40% better    Time 8    Period Weeks    Status On-going      PT LONG TERM GOAL #3   Title vaginal dryness decreased >/= 75% due to using the proper creams to improve the moisture    Baseline vaginal dryness is 405 better    Time 8    Period Weeks    Status On-going      PT LONG TERM GOAL #4   Title urination after penile penetration has buring </= 75% due to improve health of the tissue    Time 8    Period Weeks    Status On-going                 Plan - 02/27/20 1554    Clinical Impression Statement Patient reports the burning is 40% better and happens after intercourse. Patient has 40% less dryness after using the vitamin suppository and V-Magic. Patient is not having pain with intercourse. Patient has tightness in the perineal body, superior transverse perineum, and decreased movment o fthe coccyx. Patient will benefit from skilled therapy to improve tissue mobility and elongation for penile penetration and strength of the pelvic floor for coordination.    Personal Factors and Comorbidities Age;Fitness;Comorbidity 2;Sex    Comorbidities Right breasat cancer estrogen receptor positive 10/04/2011; radiation treatment 12/06/2011 to 01/02/2012    Examination-Participation Restrictions Interpersonal Relationship;Community Activity    Stability/Clinical Decision Making  Evolving/Moderate complexity    Rehab Potential Good    PT Frequency 1x / week    PT Duration 8 weeks    PT Treatment/Interventions  ADLs/Self Care Home Management;Biofeedback;Cryotherapy;Electrical Stimulation;Moist Heat;Neuromuscular re-education;Therapeutic exercise;Therapeutic activities;Patient/family education;Manual techniques;Dry needling    PT Next Visit Plan assess her doing the manual work internally and using her vibrator for increased flow; bulging of the pelvic floor with diaphragmatic breathing,  see if the [place] Reveleum is helping after intercourse,    PT Home Exercise Plan Access Code: [email or web address]    Consulted and Agree with Plan of Care Patient           Patient will benefit from skilled therapeutic intervention in order to improve the following deficits and impairments:  Decreased coordination,Increased fascial restricitons,Pain,Increased muscle spasms,Decreased endurance,Decreased activity tolerance,Decreased strength,Decreased mobility  Visit Diagnosis: Muscle weakness (generalized)  Other lack of coordination  Dyspareunia, female     Problem List Patient Active Problem List   Diagnosis Date Noted  . Alpha thalassemia trait 12/26/2013  . Malignant neoplasm of upper-outer quadrant of right breast in female, estrogen receptor positive ([place]) 12/25/2012    Earlie Counts, PT 02/27/20 4:19 PM   [facility] [address], [place], [place], [address] Phone: [phone]   Fax:  [phone]  Name: Kathleen Tamm MRN: [id number] Date of Birth: 1950-11-01

## 2020-03-09 ENCOUNTER — Ambulatory Visit: Payer: Medicare Other | Admitting: Physical Therapy

## 2020-03-16 ENCOUNTER — Ambulatory Visit: Payer: Medicare Other | Attending: Oncology | Admitting: Physical Therapy

## 2020-03-16 ENCOUNTER — Encounter: Payer: Self-pay | Admitting: Physical Therapy

## 2020-03-16 ENCOUNTER — Other Ambulatory Visit: Payer: Self-pay

## 2020-03-16 DIAGNOSIS — N941 Unspecified dyspareunia: Secondary | ICD-10-CM | POA: Insufficient documentation

## 2020-03-16 DIAGNOSIS — M6281 Muscle weakness (generalized): Secondary | ICD-10-CM | POA: Diagnosis present

## 2020-03-16 DIAGNOSIS — R278 Other lack of coordination: Secondary | ICD-10-CM | POA: Diagnosis present

## 2020-03-16 NOTE — Therapy (Signed)
[facility] [address] [place], [place], [address] Phone: [phone]   Fax:  [phone]  Physical Therapy Treatment  Patient Details  Name: Dominique Adams MRN: [id number] Date of Birth: [birth year] Referring Provider (PT): Dr. Lurline Del   Encounter Date: 03/16/2020   PT End of Session - 03/16/20 1414    Visit Number 5    Date for PT Re-Evaluation 05/16/20    Authorization Type [place] - Visit Number 5    Authorization - Number of Visits 10    PT Start Time 1400    PT Stop Time 8250    PT Time Calculation (min) 38 min    Activity Tolerance Patient tolerated treatment well;No increased pain    Behavior During Therapy [facility] for tasks assessed/performed           Past Medical History:  Diagnosis Date   Anxiety    Arthritis    Blood transfusion    no surgery   Breast cancer ([place]) 10/04/11   right breast=invasive mammary ca,ductal ca in situ w/calcifications,ER/PR=Positive   Cataract    Dysrhythmia    used to see dr Theotis Burrow put her on meds-released her 2011   History of radiation therapy 12/06/11-01/02/12   right breast,4250cGy/17sessions,boost=750cGy/3sesions   Personal history of radiation therapy    Thyroid disease    Wears glasses     Past Surgical History:  Procedure Laterality Date   BREAST LUMPECTOMY Right 2013   COLONOSCOPY  2009   neg   CYSTECTOMY  1995   lt ankle   right breast bx  10/04/11   righy uoq bx=invasive mammary ca,dcis w calcifications   right breast lumpectomy  10/28/11   right,invasive ductal ca,dcis,margins not involved,1 benign lympgh node axilla    There were no vitals filed for this visit.   Subjective Assessment - 03/16/20 1407    Subjective I am doing fine. I had intercourse with less pain.    Patient Stated Goals education on vaginal health, reduce pain with intercourse    Currently in Pain? Yes    Pain Score 3      Pain Location Vagina    Pain Orientation Mid    Pain Descriptors / Indicators Sore    Pain Type Chronic pain    Pain Onset More than a month ago    Pain Frequency Intermittent    Aggravating Factors  penile penetration, vaginal    Pain Relieving Factors no vaginal penetration    Multiple Pain Sites No              [facility] PT Assessment - 03/16/20 0001      Assessment   Medical Diagnosis N95.2 Postmenopausal atrophic vaginaitis; C50.411, Z17.0 Malignant neoplasm of upper-outer quadrant of right breast in female, estrogen receptor    Referring Provider (PT) Dr. Sarajane Jews Magrinat    Onset Date/Surgical Date 10/04/11    Prior Therapy None      Precautions   Precautions Other (comment)    Precaution Comments breast cancer estrogen positive      Restrictions   Weight Bearing Restrictions No      Prior Function   Level of Independence Independent    Vocation Retired    Leisure quilting, Scientist, physiological, Pharmacist, community   Overall Cognitive Status Within Functional Limits for tasks assessed      Strength   Right Hip ABduction 4+/5    Left Hip ABduction 4+/5  Pelvic Floor Special Questions - 03/16/20 0001    Currently Sexually Active Yes    Is this Painful Yes    Marinoff Scale discomfort that does not affect completion    Pelvic Floor Internal Exam Patient confirmed identification and approves PT to assess pelvid floor and treatment    Exam Type Vaginal    Palpation tightness in the perineal body and urethra sphincter    Strength fair squeeze, definite lift             [facility] Adult PT Treatment/Exercise - 03/16/20 0001      Neuro Re-ed    Neuro Re-ed Details  pelvic floor contraction with tactile cues to engage the lower abdominals and not contracting the hip adductors      Lumbar Exercises: Stretches   Active Hamstring Stretch Right;Left;1 rep;30 seconds    Active Hamstring Stretch Limitations supine    Single Knee to Chest  Stretch Right;Left;1 rep;20 seconds    Piriformis Stretch Right;Left;1 rep;30 seconds    Piriformis Stretch Limitations supine    Other Lumbar Stretch Exercise happy baby stretch      Manual Therapy   Manual Therapy Internal Pelvic Floor    Internal Pelvic Floor manual work to the perineal body, alnog the superior transvere, urethra sphincter                    PT Short Term Goals - 03/16/20 1411      PT SHORT TERM GOAL #1   Title independent with initial HEP for stretches and bulging of the pelvic floor    Time 4    Period Weeks    Status Achieved      PT SHORT TERM GOAL #2   Title education on vaginal moisturizers and lubricants for improved vaginal moisture and less friction with intercourse    Time 4    Period Weeks    Status Achieved             PT Long Term Goals - 03/16/20 1411      PT LONG TERM GOAL #1   Title independent with advanced HEP for pelvic floor elongation and strengthening    Time 8    Period Weeks    Status On-going      PT LONG TERM GOAL #2   Title pain with penile penetration </= 75% due to improved vaginal health and relaxation of the pelvic floor    Baseline pai level 3/10    Time 8    Period Weeks    Status On-going      PT LONG TERM GOAL #3   Title vaginal dryness decreased >/= 75% due to using the proper creams to improve the moisture    Baseline doing the suppositories. Vaginal dryness is 75% better    Period Weeks    Status Achieved      PT LONG TERM GOAL #4   Title urination after penile penetration has buring </= 75% due to improve health of the tissue    Time 8    Period Weeks    Status Achieved                 Plan - 03/16/20 1444    Clinical Impression Statement Patient reports her vaginal dryness is 75% better. Pain with penile penetration decreased to 3/10. Patient Marinoff score is now 1/3 instead of 2/3. Patient has tightness in the perineal body and urethra sphincter. Patient pain with urination after  intercoruse is 75%  better. Patient pelvic floor strength is now 3/5 instead of 2/5. Patient has increased in bilaterl hip abduction to 4+/5. Patient will benefit from skilled therapy to improve tissue mobility and elongation for penile penetration and strength of the pelvic floor for coordination.    Personal Factors and Comorbidities Age;Fitness;Comorbidity 2;Sex    Comorbidities Right breasat cancer estrogen receptor positive 10/04/2011; radiation treatment 12/06/2011 to 01/02/2012    Examination-Participation Restrictions Interpersonal Relationship;Community Activity    Stability/Clinical Decision Making Evolving/Moderate complexity    Rehab Potential Good    PT Frequency 1x / week    PT Duration 8 weeks    PT Treatment/Interventions ADLs/Self Care Home Management;Biofeedback;Cryotherapy;Electrical Stimulation;Moist Heat;Neuromuscular re-education;Therapeutic exercise;Therapeutic activities;Patient/family education;Manual techniques;Dry needling    PT Next Visit Plan assess her doing the manual work internally and using her vibrator for increased flow; bulging of the pelvic floor with diaphragmatic breathing,  see if the [place] Reveleum is helping after intercourse, possible dischager    PT Home Exercise Plan Access Code: [email or web address]    Recommended Other Services renewal sent    Consulted and Agree with Plan of Care Patient           Patient will benefit from skilled therapeutic intervention in order to improve the following deficits and impairments:  Decreased coordination,Increased fascial restricitons,Pain,Increased muscle spasms,Decreased endurance,Decreased activity tolerance,Decreased strength,Decreased mobility  Visit Diagnosis: Muscle weakness (generalized) - Plan: PT plan of care cert/re-cert  Other lack of coordination - Plan: PT plan of care cert/re-cert  Dyspareunia, female - Plan: PT plan of care cert/re-cert     Problem List Patient Active Problem List   Diagnosis Date  Noted   Alpha thalassemia trait 12/26/2013   Malignant neoplasm of upper-outer quadrant of right breast in female, estrogen receptor positive ([place]) 12/25/2012    Earlie Counts, PT 03/16/20 2:49 PM   [facility] [address] [place], [place], [address] Phone: [phone]   Fax:  [phone]  Name: Dominique Adams MRN: [id number] Date of Birth: 1950/11/28

## 2020-03-23 ENCOUNTER — Encounter: Payer: Medicare Other | Admitting: Physical Therapy

## 2020-03-30 ENCOUNTER — Ambulatory Visit: Payer: Medicare Other | Admitting: Physical Therapy

## 2020-03-30 ENCOUNTER — Encounter: Payer: Self-pay | Admitting: Physical Therapy

## 2020-03-30 ENCOUNTER — Other Ambulatory Visit: Payer: Self-pay

## 2020-03-30 DIAGNOSIS — R278 Other lack of coordination: Secondary | ICD-10-CM

## 2020-03-30 DIAGNOSIS — M6281 Muscle weakness (generalized): Secondary | ICD-10-CM

## 2020-03-30 DIAGNOSIS — N941 Unspecified dyspareunia: Secondary | ICD-10-CM

## 2020-03-30 NOTE — Therapy (Signed)
[facility] [address] [place], [place], [address] Phone: [phone]   Fax:  [phone]  Physical Therapy Treatment  Patient Details  Name: Dominique Adams MRN: [id number] Date of Birth: 1950/03/11 Referring Provider (PT): Dr. Ruthann Cancer   Encounter Date: 03/30/2020   PT End of Session - 03/30/20 1434    Visit Number 6    Date for PT Re-Evaluation 05/16/20    Authorization Type [employer] medicare    Authorization - Visit Number 6    Authorization - Number of Visits 10    PT Start Time 1400    PT Stop Time 1440    PT Time Calculation (min) 40 min    Activity Tolerance Patient tolerated treatment well;No increased pain    Behavior During Therapy WFL for tasks assessed/performed           Past Medical History:  Diagnosis Date  . Anxiety   . Arthritis   . Blood transfusion    no surgery  . Breast cancer ([facility]) 10/04/11   right breast=invasive mammary ca,ductal ca in situ w/calcifications,ER/PR=Positive  . Cataract   . Dysrhythmia    used to see dr tennant-he put her on meds-released her 2011  . History of radiation therapy 12/06/11-01/02/12   right breast,4250cGy/17sessions,boost=750cGy/3sesions  . Personal history of radiation therapy   . Thyroid disease   . Wears glasses     Past Surgical History:  Procedure Laterality Date  . BREAST LUMPECTOMY Right 2013  . COLONOSCOPY  2009   neg  . CYSTECTOMY  1995   lt ankle  . right breast bx  10/04/11   righy uoq bx=invasive mammary ca,dcis w calcifications  . right breast lumpectomy  10/28/11   right,invasive ductal ca,dcis,margins not involved,1 benign lympgh node axilla    There were no vitals filed for this visit.   Subjective Assessment - 03/30/20 1405    Subjective I have not been intimate to see about the pain. When I go to the bathroom no pain. The vaginal tissue is better. I feel today can be my last day.    Patient Stated Goals education on  vaginal health, reduce pain with intercourse    Currently in Pain? 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 Raymond Gurney Magrinat    Onset Date/Surgical Date 10/04/11    Prior Therapy None      Precautions   Precautions Other (comment)    Precaution Comments breast cancer estrogen positive      Restrictions   Weight Bearing Restrictions No      Prior Function   Level of Independence Independent    Vocation Retired    Leisure quilting, Art therapist, Audiological scientist   Overall Cognitive Status Within Functional Limits for tasks assessed      Posture/Postural Control   Posture/Postural Control No significant limitations      Strength   Right Hip ABduction 5/5    Left Hip ABduction 5/5                      Pelvic Floor Special Questions - 03/30/20 0001    Currently Sexually Active Yes    Is this Painful  No    Marinoff Scale discomfort that does not affect completion    Urinary Leakage No    Fecal incontinence No    Pelvic Floor Internal Exam Patient confirmed identification and approves PT to assess pelvid floor and treatment    Exam Type Vaginal    Strength fair squeeze, definite lift             [facility] Adult PT Treatment/Exercise - 03/30/20 0001      Self-Care   Self-Care Other Self-Care Comments    Other Self-Care Comments  discussed with patient on the urge to void, counting backwards, trying different path to the bathroom: discussed with patient on vibratir; taking a bath to massage the pelvic floor muscles,      Lumbar Exercises: Stretches   Active Hamstring Stretch Right;Left;1 rep;30 seconds    Active Hamstring Stretch Limitations supine with strap    ITB Stretch Right;Left;1 rep;30 seconds    ITB Stretch  Limitations supine with strap    Other Lumbar Stretch Exercise hip adductor stretch with strap supine right , left, holding for 30 sec each way      Manual Therapy   Manual Therapy Internal Pelvic Floor    Internal Pelvic Floor manual work to the perineal body, alnog the superior transvere in left sidely                    PT Short Term Goals - 03/16/20 1411      PT SHORT TERM GOAL #1   Title independent with initial HEP for stretches and bulging of the pelvic floor    Time 4    Period Weeks    Status Achieved      PT SHORT TERM GOAL #2   Title education on vaginal moisturizers and lubricants for improved vaginal moisture and less friction with intercourse    Time 4    Period Weeks    Status Achieved             PT Long Term Goals - 03/30/20 1411      PT LONG TERM GOAL #1   Title independent with advanced HEP for pelvic floor elongation and strengthening    Time 8    Period Weeks    Status Achieved      PT LONG TERM GOAL #2   Title pain with penile penetration </= 75% due to improved vaginal health and relaxation of the pelvic floor    Time 8    Period Weeks    Status Achieved      PT LONG TERM GOAL #3   Title vaginal dryness decreased >/= 75% due to using the proper creams to improve the moisture    Time 8    Period Weeks    Status Achieved      PT LONG TERM GOAL #4   Title urination after penile penetration has buring </= 75% due to improve health of the tissue    Time 8    Period Weeks    Status Achieved                 Plan - 03/30/20 1446    Clinical Impression Statement Patient pelvic floor strength is 3/5. Patient is not having pain with urination. Patient has improve mobility of the perineal body and bulbocavernosus. Patient is independent with her HEP. Patient has not been intimate with her husband so she does not know if there is any pain. Patient has improve moisture in the  vaginal area. Patient reports she is ready for discharge.     Personal Factors and Comorbidities Age;Fitness;Comorbidity 2;Sex    Comorbidities Right breasat cancer estrogen receptor positive 10/04/2011; radiation treatment 12/06/2011 to 01/02/2012    Examination-Participation Restrictions Interpersonal Relationship;Community Activity    Stability/Clinical Decision Making Evolving/Moderate complexity    Rehab Potential Good    PT Treatment/Interventions ADLs/Self Care Home Management;Biofeedback;Cryotherapy;Electrical Stimulation;Moist Heat;Neuromuscular re-education;Therapeutic exercise;Therapeutic activities;Patient/family education;Manual techniques;Dry needling    PT Next Visit Plan Discharge to HEP    PT Home Exercise Plan Access Code: [email or web address]    Consulted and Agree with Plan of Care Patient           Patient will benefit from skilled therapeutic intervention in order to improve the following deficits and impairments:  Decreased coordination,Increased fascial restricitons,Pain,Increased muscle spasms,Decreased endurance,Decreased activity tolerance,Decreased strength,Decreased mobility  Visit Diagnosis: Muscle weakness (generalized)  Other lack of coordination  Dyspareunia, female     Problem List Patient Active Problem List   Diagnosis Date Noted  . Alpha thalassemia trait 12/26/2013  . Malignant neoplasm of upper-outer quadrant of right breast in female, estrogen receptor positive ([place]) 12/25/2012    Earlie Counts, PT 03/30/20 3:19 PM   [facility] [address] [place], [place], [address] Phone: [phone]   Fax:  [phone]  Name: Dominique Adams MRN: [id number] Date of Birth: 06-11-1950  PHYSICAL THERAPY DISCHARGE SUMMARY  Visits from Start of Care: 3  Current functional level related to goals / functional outcomes: See above.    Remaining deficits: See above.    Education / Equipment: HEP Plan: Patient agrees to discharge.  Patient goals  were met. Patient is being discharged due to meeting the stated rehab goals.  Thank you for the referral. Earlie Counts, PT 03/30/20 3:20 PM  ?????

## 2020-04-06 ENCOUNTER — Encounter: Payer: Medicare Other | Admitting: Physical Therapy

## 2020-04-13 ENCOUNTER — Encounter: Payer: Medicare Other | Admitting: Physical Therapy

## 2020-09-14 ENCOUNTER — Other Ambulatory Visit: Payer: Self-pay | Admitting: Internal Medicine

## 2020-09-14 DIAGNOSIS — Z1231 Encounter for screening mammogram for malignant neoplasm of breast: Secondary | ICD-10-CM

## 2020-11-06 ENCOUNTER — Other Ambulatory Visit: Payer: Self-pay

## 2020-11-06 ENCOUNTER — Ambulatory Visit
Admission: RE | Admit: 2020-11-06 | Discharge: 2020-11-06 | Disposition: A | Discharge status: Home / Self Care | Payer: Medicare Other | Source: Ambulatory Visit | Attending: Internal Medicine | Admitting: Internal Medicine

## 2020-11-06 DIAGNOSIS — Z1231 Encounter for screening mammogram for malignant neoplasm of breast: Secondary | ICD-10-CM

## 2021-06-01 ENCOUNTER — Other Ambulatory Visit: Payer: Self-pay | Admitting: Internal Medicine

## 2021-06-01 DIAGNOSIS — E785 Hyperlipidemia, unspecified: Secondary | ICD-10-CM

## 2021-06-29 ENCOUNTER — Ambulatory Visit
Admission: RE | Admit: 2021-06-29 | Discharge: 2021-06-29 | Disposition: A | Discharge status: Home / Self Care | Payer: No Typology Code available for payment source | Source: Ambulatory Visit | Attending: Internal Medicine | Admitting: Internal Medicine

## 2021-06-29 DIAGNOSIS — E785 Hyperlipidemia, unspecified: Secondary | ICD-10-CM

## 2021-09-29 ENCOUNTER — Other Ambulatory Visit: Payer: Self-pay | Admitting: Internal Medicine

## 2021-09-29 DIAGNOSIS — Z1231 Encounter for screening mammogram for malignant neoplasm of breast: Secondary | ICD-10-CM

## 2021-11-09 ENCOUNTER — Ambulatory Visit
Admission: RE | Admit: 2021-11-09 | Discharge: 2021-11-09 | Disposition: A | Discharge status: Home / Self Care | Payer: Medicare Other | Source: Ambulatory Visit | Attending: Internal Medicine | Admitting: Internal Medicine

## 2021-11-09 DIAGNOSIS — Z1231 Encounter for screening mammogram for malignant neoplasm of breast: Secondary | ICD-10-CM

## 2022-05-28 IMAGING — CT CT CARDIAC CORONARY ARTERY CALCIUM SCORE
3 series · 12 of 20 positions shown, 14 images · non-contrast
Comparison: None.

CLINICAL DATA: Hyperlipidemia

EXAM:
CT CARDIAC CORONARY ARTERY CALCIUM SCORE
TECHNIQUE: Non-contrast imaging through the heart was performed using
prospective ECG gating. Image post processing was performed on an
independent workstation, allowing for quantitative analysis of the
heart and coronary arteries. Note that this exam targets the heart
and the chest was not imaged in its entirety.

[Series 2: calcium scoring 2.00 qr36 bestdiast 64% hrt calciu · axial · 0.36mm/px · z∈[+1507,+1535]mm · 2 of 70 slices shown]
[im 14/70  vessel]
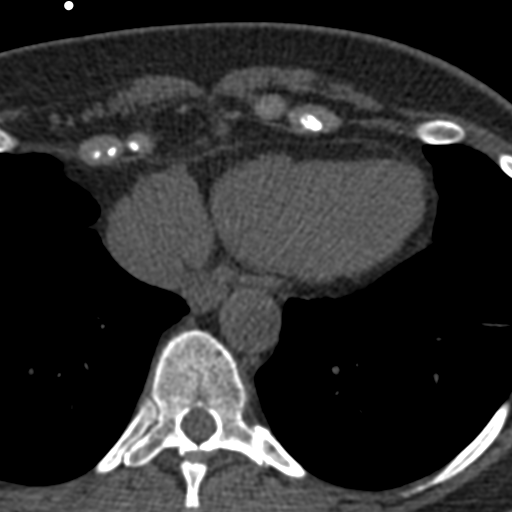
[im 28/70  vessel]
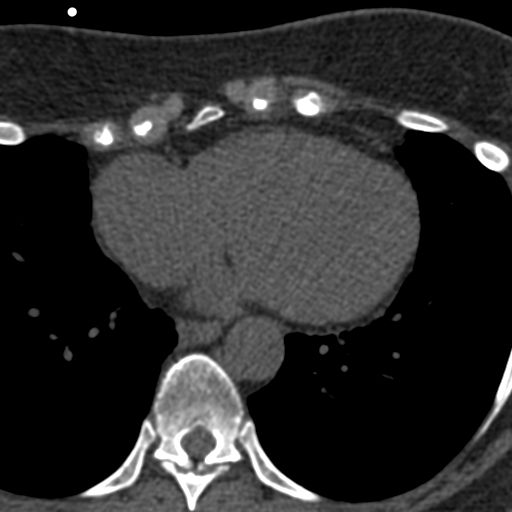

[Series 3: calcium scoring 2.00 br40 bestdiast 64% axial · axial · 0.45mm/px · z∈[+1503,+1595]mm · 5 of 70 slices shown, 7 images]
[im 12/70  vessel]
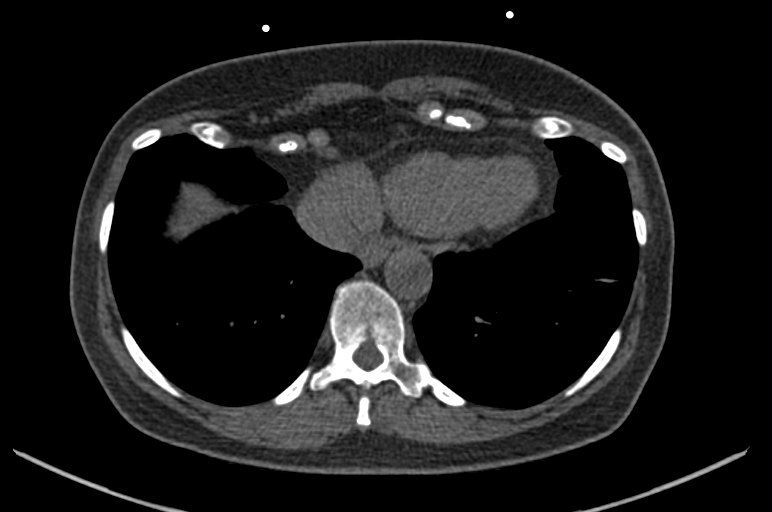
[im 12/70  lung]
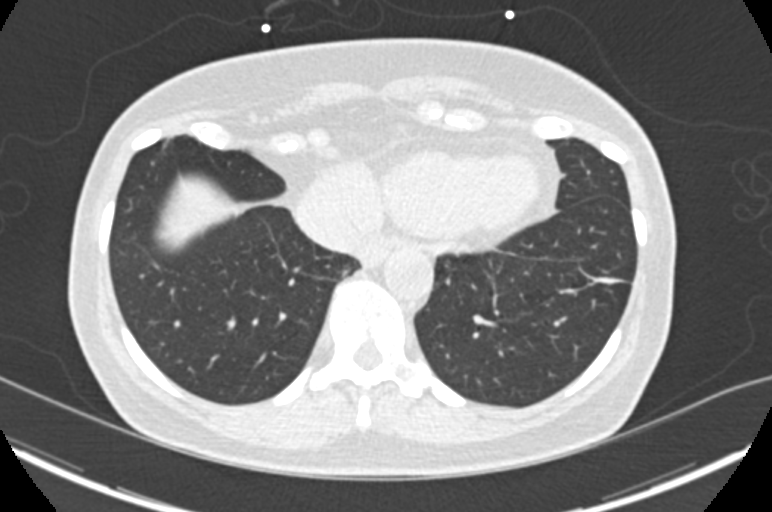
[im 24/70  vessel]
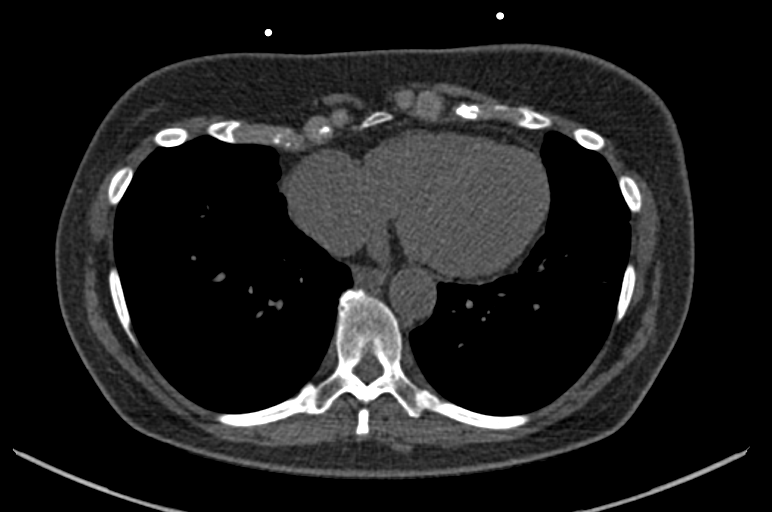
[im 35/70  vessel]
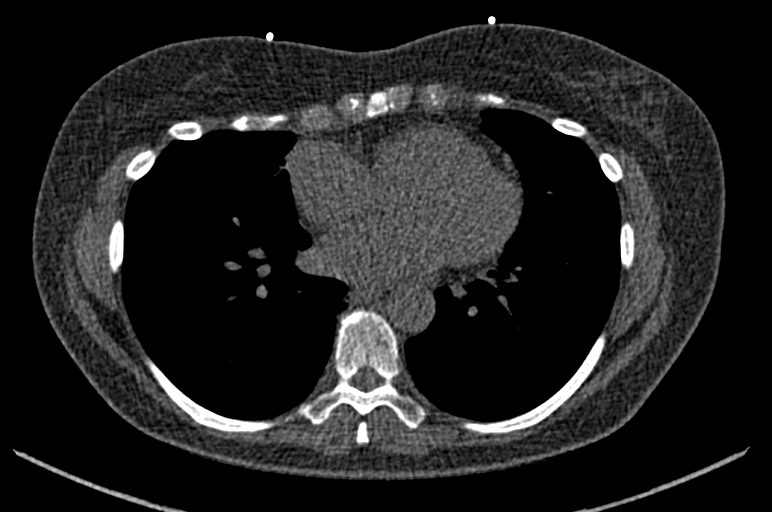
[im 47/70  vessel]
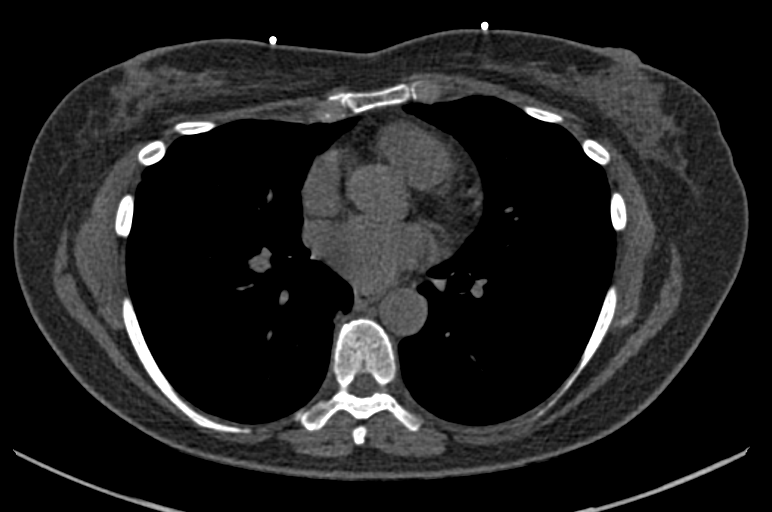
[im 58/70  vessel]
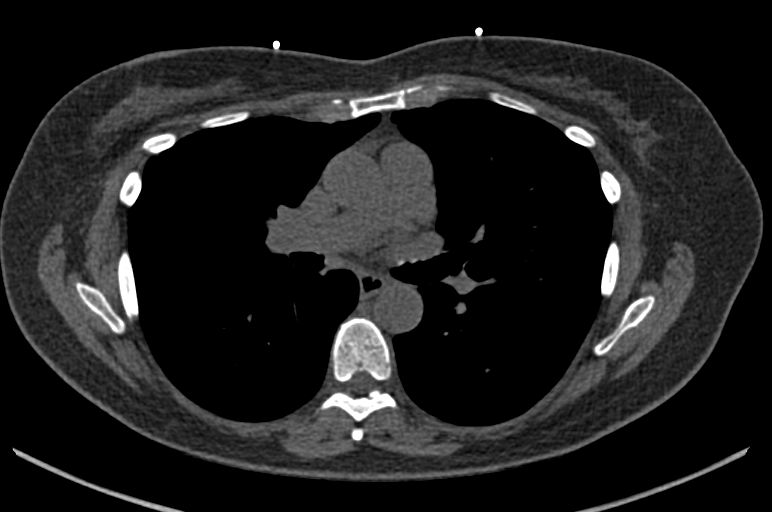
[im 58/70  lung]
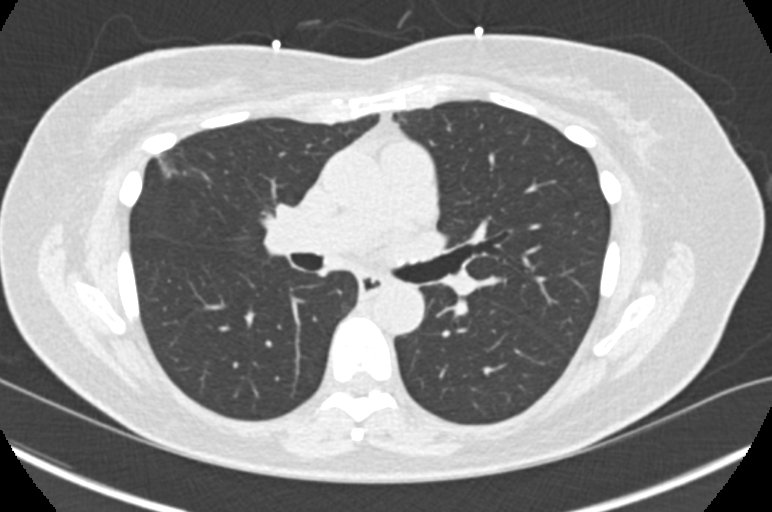

[Series 9: calcium scoring 2.00 br60 bestdiast 64% lungs · axial · 0.45mm/px · z∈[+1503,+1595]mm · 5 of 70 slices shown]
[im 12/70  vessel]
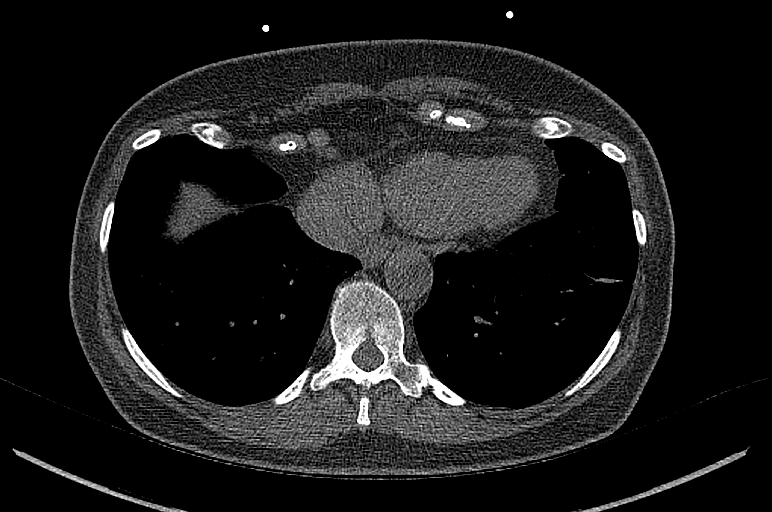
[im 24/70  vessel]
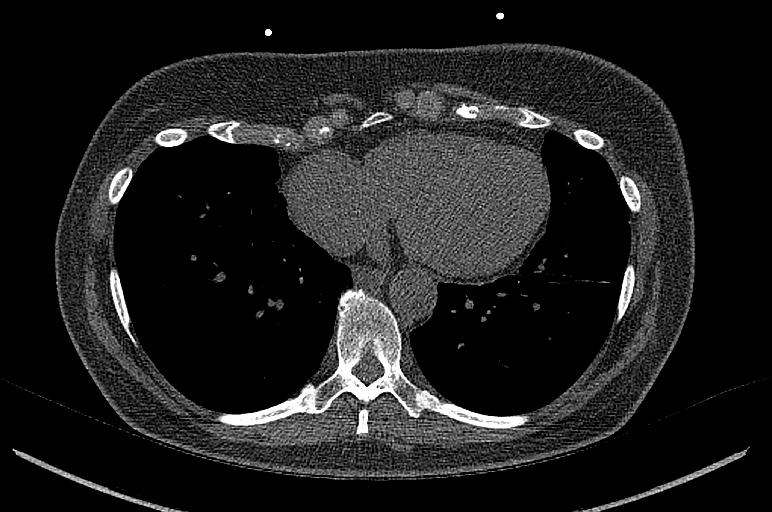
[im 35/70  vessel]
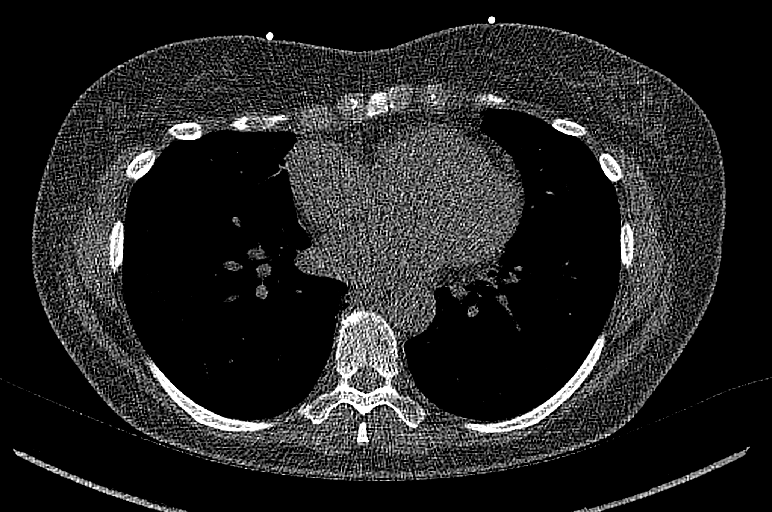
[im 47/70  vessel]
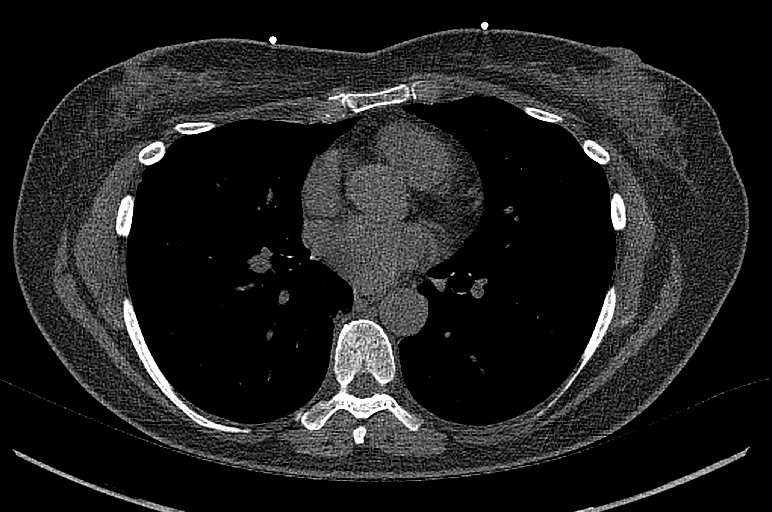
[im 58/70  vessel]
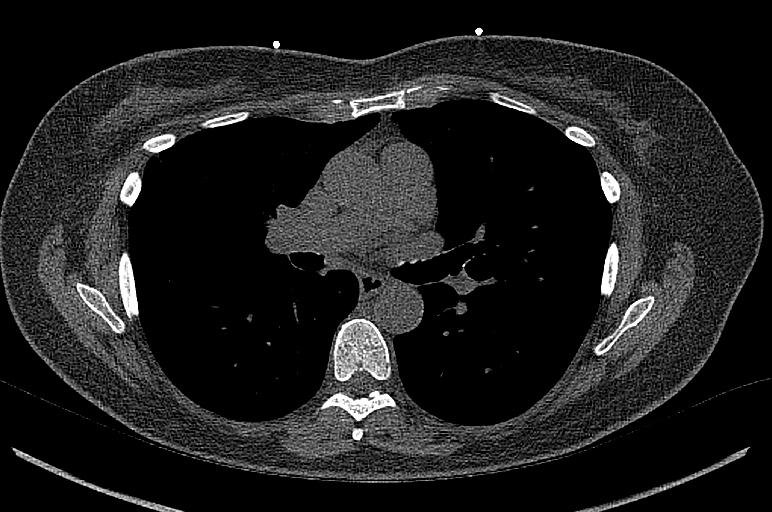

[12 of 20 positions shown; findings below may reference images not displayed]

FINDINGS: CORONARY CALCIUM SCORES:

Left Main: 0

LAD: 0

LCx: 0

RCA: 0

Total Agatston Score: 0

[HOSPITAL] percentile: 0

AORTA MEASUREMENTS:

Ascending Aorta: 23 mm

Descending Aorta: 21 mm

OTHER FINDINGS:

Heart is normal size. Aorta normal caliber. No adenopathy. No
confluent opacities or effusions. No acute findings in the upper
abdomen. Chest wall soft tissues are unremarkable. No acute bony
abnormality.
IMPRESSION: No visible coronary artery calcifications. Total coronary calcium
score of 0.

No acute or significant extracardiac abnormality.

## 2022-10-07 ENCOUNTER — Other Ambulatory Visit: Payer: Self-pay | Admitting: Internal Medicine

## 2022-10-07 DIAGNOSIS — Z1231 Encounter for screening mammogram for malignant neoplasm of breast: Secondary | ICD-10-CM

## 2022-11-16 ENCOUNTER — Ambulatory Visit
Admission: RE | Admit: 2022-11-16 | Discharge: 2022-11-16 | Disposition: A | Discharge status: Home / Self Care | Payer: Medicare Other | Source: Ambulatory Visit | Attending: Internal Medicine | Admitting: Internal Medicine

## 2022-11-16 DIAGNOSIS — Z1231 Encounter for screening mammogram for malignant neoplasm of breast: Secondary | ICD-10-CM

## 2023-10-17 ENCOUNTER — Other Ambulatory Visit: Payer: Self-pay | Admitting: Internal Medicine

## 2023-10-17 DIAGNOSIS — Z1231 Encounter for screening mammogram for malignant neoplasm of breast: Secondary | ICD-10-CM

## 2023-11-17 ENCOUNTER — Encounter: Payer: Self-pay | Admitting: Internal Medicine

## 2023-11-17 ENCOUNTER — Ambulatory Visit
Admission: RE | Admit: 2023-11-17 | Discharge: 2023-11-17 | Disposition: A | Discharge status: Home / Self Care | Source: Ambulatory Visit | Attending: Internal Medicine | Admitting: Internal Medicine

## 2023-11-17 DIAGNOSIS — Z1231 Encounter for screening mammogram for malignant neoplasm of breast: Secondary | ICD-10-CM
